# Patient Record
Sex: Female | Born: 1935 | Race: White | Hispanic: No | Marital: Married | Smoking: Former smoker
Health system: Southern US, Community
[De-identification: ages and names within clinical notes are randomized; demographics above are authoritative.]

## PROBLEM LIST (undated history)

## (undated) DIAGNOSIS — G4730 Sleep apnea, unspecified: Secondary | ICD-10-CM

## (undated) DIAGNOSIS — E119 Type 2 diabetes mellitus without complications: Secondary | ICD-10-CM

## (undated) DIAGNOSIS — I10 Essential (primary) hypertension: Secondary | ICD-10-CM

## (undated) HISTORY — DX: Type 2 diabetes mellitus without complications: E11.9

## (undated) HISTORY — DX: Sleep apnea, unspecified: G47.30

## (undated) HISTORY — DX: Essential (primary) hypertension: I10

## (undated) HISTORY — PX: FOOT SURGERY: SHX648

---

## 2001-08-06 ENCOUNTER — Encounter: Payer: Self-pay | Admitting: Neurosurgery

## 2001-08-10 ENCOUNTER — Inpatient Hospital Stay (HOSPITAL_COMMUNITY): Admission: RE | Admit: 2001-08-10 | Discharge: 2001-08-11 | Payer: Self-pay | Admitting: Neurosurgery

## 2001-08-10 ENCOUNTER — Encounter: Payer: Self-pay | Admitting: Neurosurgery

## 2007-10-09 ENCOUNTER — Ambulatory Visit (HOSPITAL_COMMUNITY): Admission: RE | Admit: 2007-10-09 | Discharge: 2007-10-09 | Payer: Self-pay | Admitting: Ophthalmology

## 2010-07-30 NOTE — H&P (Signed)
[place]. [facility]  Patient:    Lisa Larsen, LUMBRA Visit Number: [id number] MRN: [id number]          Service Type: SUR Location: 3000 [id number] Attending Physician:  Emeterio Reeve Dictated by:   Payton Doughty, M.D. 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  HISTORY OF PRESENT ILLNESS: This is a 77 year old right-handed white lady with a couple of years of increasing numbness and pain in her feet, described in the bottom than top, worse on the bottom and top, and there is some disk disease associated with with it.  She has been on Neurontin and several other medications to no avail.  She is currently on Trileptal 200 mg b.i.d., which seems to help a little bit.  EMG showed bilateral S1 radiculopathy.  MRI shows severe spinal stenosis at 5-1 related to facet arthropathy, and she was referred to me.  PAST MEDICAL HISTORY: Remarkable for hypertension.  CURRENT MEDICATIONS:  1. Diovan 12.5 mg q.d.  2. Trileptal 200 mg b.i.d.  4. Multivitamin.  5. Vitamin D.  6. Vitamin E.  7. Glucosamine.  8. B-12.  9. Acid reducer. 10. Tums. 11. Aspirin.  ALLERGIES: None.  PAST SURGICAL HISTORY: Fractured heel in 1999.  SOCIAL HISTORY: She does not smoke and drinks only socially wine, and is retired from [employer].  FAMILY HISTORY: Parents are deceased, history is not given.  REVIEW OF SYSTEMS: Remarkable for hypertension, back pain, and basal cell skin cancer on her face.  PHYSICAL EXAMINATION:  HEENT: Examination within normal limits save from bloodshot left eye.  NECK: Reasonable range of motion.  CHEST: Clear.  CARDIAC: Regular rate and rhythm.  ABDOMEN: Nontender.  No hepatosplenomegaly.  EXTREMITIES: Without clubbing or cyanosis.  Peripheral pulses good.  GU: Examination deferred.  NEUROLOGIC:  She is awake, alert, and oriented.  Cranial nerves intact.  Motor examination shows 5/5 strength throughout the upper and lower extremities.  No current sensory deficits save for S1 distribution bilaterally.  Reflexes are two at the knees, absent at the ankles bilaterally.  Straight-leg-raise is positive bilaterally.  LABORATORY DATA: MRI demonstrates significant stenosis at 5-1 due to facet arthropathy as well as bulging disk centrally, 4-5 has moderate stenosis for the same reasons.  MRA shows a very large S1-S2 disk.  CLINICAL IMPRESSION: Neurogenic claudication secondary to lumbar spondylosis.  PLAN: The plan is for bilateral laminectomy and foraminotomy at L4-5 and L5-S1.  The risks and benefits of this approach have been discussed with her and she wishes to proceed. Dictated by:   Payton Doughty, M.D. Attending Physician:  Emeterio Reeve DD:  08/10/01 TD:  08/11/01 Job: [id number]

## 2010-07-30 NOTE — Op Note (Signed)
[place]. [facility]  Patient:    Lisa Larsen, Lisa Larsen Visit Number: [id number] MRN: [id number]          Service Type: SUR Location: 3000 [id number] [id number] Attending Physician:  Emeterio Reeve Dictated by:   Payton Doughty, M.D. Proc. Date: 08/10/01 Admit Date:  08/10/2001                             Operative Report  PREOPERATIVE DIAGNOSIS:  Spondylosis L5-S1 and L4-L5.  POSTOPERATIVE DIAGNOSIS:  Spondylosis L5-S1 and L4-L5.  OPERATIVE PROCEDURE:  L4-L5 and L5-S1 laminotomy, foraminotomy done bilaterally.  SURGEON:  Payton Doughty, M.D.  ANESTHESIA:  Endotracheal.  PREPARATION:  Sterile Betadine scrub and prep with sterile alcohol wipe.  COMPLICATIONS:  None.  NURSE ASSISTANT:  [place].  DOCTOR ASSISTANT:  Cristi Loron, M.D.  PROCEDURE:  This is a 77 year old lady with severe radicular claudication in the S1 distribution. She was taken to the operating room, where she was smoothly anesthetized and intubated, and placed prone on the operating table. Following shave, prep and drape in the usual sterile fashion, skin was infiltrated with 1% lidocaine 1:400,000 with epinephrine. The skin was incised from the top of S2 to the bottom of L3, and lamina of L4, L5, and S1 were exposed bilaterally. Several x-rays were taken to confirm correctness of level. The patient had a very prominent S1-S2 interspace and meticulous care was taken to make sure decompression was done at the appropriate levels. At L5-S1 and L4-5, hemi-semilaminectomies were carried out to the top of the ligamentum flavum which was then removed in retrograde fashion. The facet joint was undercut at L5-S1 and the S1 nerve root carefully identified and decompressed. The L5 root was also identified, followed at the neural foramen, and decompressed. At L4-5, ligamentum flavum was removed and the L4 root and L5 root were carefully identified and decompressed. A large portion of the pars  interarticularis was left intact. There was no disruption of facet joint capsules. Following complete decompression bilaterally and exploration of neural foramen, the wound was irrigated and hemostasis assured. Subcutaneous fat was taken and placed in the laminotomy defects. The fascia was then reapproximated with 0 Vicryl in interrupted fashion. Subcutaneous tissues were reapproximated with 0 Vicryl in interrupted fashion. Subcuticular tissues were approximated with 3-0 Vicryl in interrupted fashion. The skin was closed with 3-0 nylon in running lock fashion. A bacitracin and Telfa dressing was applied and made occlusive at the op site. The patient then returned to the recovery room in good condition. Dictated by:   Payton Doughty, M.D. Attending Physician:  Emeterio Reeve DD:  08/10/01 TD:  08/11/01 Job: [id number]

## 2010-12-10 LAB — BASIC METABOLIC PANEL
BUN: 19
CO2: 29
Calcium: 9.9
Chloride: 104
Creatinine, Ser: 0.72
GFR calc Af Amer: >60
GFR calc non Af Amer: >60
Glucose, Bld: 83
Potassium: 3.9
Sodium: 139

## 2010-12-10 LAB — CBC
HCT: 40.5
Hemoglobin: 14.3
MCHC: 35.2
MCV: 99.8
Platelets: 117 — ABNORMAL LOW
RBC: 4.06
RDW: 12.5
WBC: 6.3

## 2014-03-17 DIAGNOSIS — Z1231 Encounter for screening mammogram for malignant neoplasm of breast: Secondary | ICD-10-CM | POA: Diagnosis not present

## 2014-03-24 DIAGNOSIS — Z08 Encounter for follow-up examination after completed treatment for malignant neoplasm: Secondary | ICD-10-CM | POA: Diagnosis not present

## 2014-03-24 DIAGNOSIS — X32XXXA Exposure to sunlight, initial encounter: Secondary | ICD-10-CM | POA: Diagnosis not present

## 2014-03-24 DIAGNOSIS — Z85828 Personal history of other malignant neoplasm of skin: Secondary | ICD-10-CM | POA: Diagnosis not present

## 2014-03-24 DIAGNOSIS — D220 Melanocytic nevi of lip: Secondary | ICD-10-CM | POA: Diagnosis not present

## 2014-03-24 DIAGNOSIS — L570 Actinic keratosis: Secondary | ICD-10-CM | POA: Diagnosis not present

## 2014-03-24 DIAGNOSIS — L820 Inflamed seborrheic keratosis: Secondary | ICD-10-CM | POA: Diagnosis not present

## 2014-03-24 DIAGNOSIS — D0471 Carcinoma in situ of skin of right lower limb, including hip: Secondary | ICD-10-CM | POA: Diagnosis not present

## 2014-03-26 DIAGNOSIS — E1165 Type 2 diabetes mellitus with hyperglycemia: Secondary | ICD-10-CM | POA: Diagnosis not present

## 2014-03-26 DIAGNOSIS — I10 Essential (primary) hypertension: Secondary | ICD-10-CM | POA: Diagnosis not present

## 2014-03-26 DIAGNOSIS — E782 Mixed hyperlipidemia: Secondary | ICD-10-CM | POA: Diagnosis not present

## 2014-03-26 DIAGNOSIS — E780 Pure hypercholesterolemia: Secondary | ICD-10-CM | POA: Diagnosis not present

## 2014-04-02 DIAGNOSIS — E1142 Type 2 diabetes mellitus with diabetic polyneuropathy: Secondary | ICD-10-CM | POA: Diagnosis not present

## 2014-04-02 DIAGNOSIS — Z1389 Encounter for screening for other disorder: Secondary | ICD-10-CM | POA: Diagnosis not present

## 2014-04-02 DIAGNOSIS — G4733 Obstructive sleep apnea (adult) (pediatric): Secondary | ICD-10-CM | POA: Diagnosis not present

## 2014-04-02 DIAGNOSIS — E782 Mixed hyperlipidemia: Secondary | ICD-10-CM | POA: Diagnosis not present

## 2014-04-02 DIAGNOSIS — E6609 Other obesity due to excess calories: Secondary | ICD-10-CM | POA: Diagnosis not present

## 2014-04-02 DIAGNOSIS — E8881 Metabolic syndrome: Secondary | ICD-10-CM | POA: Diagnosis not present

## 2014-07-03 DIAGNOSIS — G4733 Obstructive sleep apnea (adult) (pediatric): Secondary | ICD-10-CM | POA: Diagnosis not present

## 2014-07-10 DIAGNOSIS — R0902 Hypoxemia: Secondary | ICD-10-CM | POA: Diagnosis not present

## 2014-08-09 DIAGNOSIS — R0902 Hypoxemia: Secondary | ICD-10-CM | POA: Diagnosis not present

## 2014-08-09 DIAGNOSIS — G4733 Obstructive sleep apnea (adult) (pediatric): Secondary | ICD-10-CM | POA: Diagnosis not present

## 2014-08-25 DIAGNOSIS — X32XXXD Exposure to sunlight, subsequent encounter: Secondary | ICD-10-CM | POA: Diagnosis not present

## 2014-08-25 DIAGNOSIS — L570 Actinic keratosis: Secondary | ICD-10-CM | POA: Diagnosis not present

## 2014-08-25 DIAGNOSIS — C44729 Squamous cell carcinoma of skin of left lower limb, including hip: Secondary | ICD-10-CM | POA: Diagnosis not present

## 2014-09-02 DIAGNOSIS — L0100 Impetigo, unspecified: Secondary | ICD-10-CM | POA: Diagnosis not present

## 2014-09-09 DIAGNOSIS — R0902 Hypoxemia: Secondary | ICD-10-CM | POA: Diagnosis not present

## 2014-09-11 DIAGNOSIS — R4182 Altered mental status, unspecified: Secondary | ICD-10-CM | POA: Diagnosis not present

## 2014-09-11 DIAGNOSIS — G629 Polyneuropathy, unspecified: Secondary | ICD-10-CM | POA: Diagnosis not present

## 2014-09-11 DIAGNOSIS — J449 Chronic obstructive pulmonary disease, unspecified: Secondary | ICD-10-CM | POA: Diagnosis not present

## 2014-09-11 DIAGNOSIS — I959 Hypotension, unspecified: Secondary | ICD-10-CM | POA: Diagnosis not present

## 2014-09-11 DIAGNOSIS — K760 Fatty (change of) liver, not elsewhere classified: Secondary | ICD-10-CM | POA: Diagnosis not present

## 2014-09-11 DIAGNOSIS — Z79899 Other long term (current) drug therapy: Secondary | ICD-10-CM | POA: Diagnosis not present

## 2014-09-11 DIAGNOSIS — J984 Other disorders of lung: Secondary | ICD-10-CM | POA: Diagnosis not present

## 2014-09-11 DIAGNOSIS — I10 Essential (primary) hypertension: Secondary | ICD-10-CM | POA: Diagnosis not present

## 2014-09-11 DIAGNOSIS — Z7982 Long term (current) use of aspirin: Secondary | ICD-10-CM | POA: Diagnosis not present

## 2014-09-11 DIAGNOSIS — D696 Thrombocytopenia, unspecified: Secondary | ICD-10-CM | POA: Diagnosis not present

## 2014-09-11 DIAGNOSIS — G459 Transient cerebral ischemic attack, unspecified: Secondary | ICD-10-CM | POA: Diagnosis not present

## 2014-09-11 DIAGNOSIS — R918 Other nonspecific abnormal finding of lung field: Secondary | ICD-10-CM | POA: Diagnosis not present

## 2014-09-11 DIAGNOSIS — R55 Syncope and collapse: Secondary | ICD-10-CM | POA: Diagnosis not present

## 2014-09-11 DIAGNOSIS — Z79891 Long term (current) use of opiate analgesic: Secondary | ICD-10-CM | POA: Diagnosis not present

## 2014-09-11 DIAGNOSIS — I6523 Occlusion and stenosis of bilateral carotid arteries: Secondary | ICD-10-CM | POA: Diagnosis not present

## 2014-09-11 DIAGNOSIS — J9811 Atelectasis: Secondary | ICD-10-CM | POA: Diagnosis not present

## 2014-09-11 DIAGNOSIS — E860 Dehydration: Secondary | ICD-10-CM | POA: Diagnosis not present

## 2014-09-11 DIAGNOSIS — M1990 Unspecified osteoarthritis, unspecified site: Secondary | ICD-10-CM | POA: Diagnosis not present

## 2014-09-11 DIAGNOSIS — T678XXA Other effects of heat and light, initial encounter: Secondary | ICD-10-CM | POA: Diagnosis not present

## 2014-09-11 DIAGNOSIS — E785 Hyperlipidemia, unspecified: Secondary | ICD-10-CM | POA: Diagnosis not present

## 2014-09-11 DIAGNOSIS — M545 Low back pain: Secondary | ICD-10-CM | POA: Diagnosis not present

## 2014-09-11 DIAGNOSIS — R410 Disorientation, unspecified: Secondary | ICD-10-CM | POA: Diagnosis not present

## 2014-09-11 DIAGNOSIS — R4781 Slurred speech: Secondary | ICD-10-CM | POA: Diagnosis not present

## 2014-09-12 DIAGNOSIS — G459 Transient cerebral ischemic attack, unspecified: Secondary | ICD-10-CM | POA: Diagnosis not present

## 2014-09-12 DIAGNOSIS — I959 Hypotension, unspecified: Secondary | ICD-10-CM | POA: Diagnosis not present

## 2014-09-29 DIAGNOSIS — Z85828 Personal history of other malignant neoplasm of skin: Secondary | ICD-10-CM | POA: Diagnosis not present

## 2014-09-29 DIAGNOSIS — Z08 Encounter for follow-up examination after completed treatment for malignant neoplasm: Secondary | ICD-10-CM | POA: Diagnosis not present

## 2014-09-29 DIAGNOSIS — L980 Pyogenic granuloma: Secondary | ICD-10-CM | POA: Diagnosis not present

## 2014-10-09 DIAGNOSIS — R0902 Hypoxemia: Secondary | ICD-10-CM | POA: Diagnosis not present

## 2014-10-12 DIAGNOSIS — I700 Atherosclerosis of aorta: Secondary | ICD-10-CM | POA: Diagnosis not present

## 2014-10-12 DIAGNOSIS — T673XXD Heat exhaustion, anhydrotic, subsequent encounter: Secondary | ICD-10-CM | POA: Diagnosis not present

## 2014-10-12 DIAGNOSIS — R911 Solitary pulmonary nodule: Secondary | ICD-10-CM | POA: Diagnosis not present

## 2014-10-12 DIAGNOSIS — I6523 Occlusion and stenosis of bilateral carotid arteries: Secondary | ICD-10-CM | POA: Diagnosis not present

## 2014-10-13 DIAGNOSIS — Z08 Encounter for follow-up examination after completed treatment for malignant neoplasm: Secondary | ICD-10-CM | POA: Diagnosis not present

## 2014-10-13 DIAGNOSIS — Z85828 Personal history of other malignant neoplasm of skin: Secondary | ICD-10-CM | POA: Diagnosis not present

## 2014-10-16 DIAGNOSIS — E782 Mixed hyperlipidemia: Secondary | ICD-10-CM | POA: Diagnosis not present

## 2014-10-16 DIAGNOSIS — I10 Essential (primary) hypertension: Secondary | ICD-10-CM | POA: Diagnosis not present

## 2014-10-16 DIAGNOSIS — E8881 Metabolic syndrome: Secondary | ICD-10-CM | POA: Diagnosis not present

## 2014-10-16 DIAGNOSIS — E1142 Type 2 diabetes mellitus with diabetic polyneuropathy: Secondary | ICD-10-CM | POA: Diagnosis not present

## 2014-10-22 DIAGNOSIS — E1142 Type 2 diabetes mellitus with diabetic polyneuropathy: Secondary | ICD-10-CM | POA: Diagnosis not present

## 2014-10-22 DIAGNOSIS — G6289 Other specified polyneuropathies: Secondary | ICD-10-CM | POA: Diagnosis not present

## 2014-10-22 DIAGNOSIS — E6609 Other obesity due to excess calories: Secondary | ICD-10-CM | POA: Diagnosis not present

## 2014-10-22 DIAGNOSIS — Z0001 Encounter for general adult medical examination with abnormal findings: Secondary | ICD-10-CM | POA: Diagnosis not present

## 2014-10-22 DIAGNOSIS — G4733 Obstructive sleep apnea (adult) (pediatric): Secondary | ICD-10-CM | POA: Diagnosis not present

## 2014-11-03 DIAGNOSIS — L929 Granulomatous disorder of the skin and subcutaneous tissue, unspecified: Secondary | ICD-10-CM | POA: Diagnosis not present

## 2014-11-03 DIAGNOSIS — Z85828 Personal history of other malignant neoplasm of skin: Secondary | ICD-10-CM | POA: Diagnosis not present

## 2014-11-03 DIAGNOSIS — Z08 Encounter for follow-up examination after completed treatment for malignant neoplasm: Secondary | ICD-10-CM | POA: Diagnosis not present

## 2014-11-09 DIAGNOSIS — R0902 Hypoxemia: Secondary | ICD-10-CM | POA: Diagnosis not present

## 2014-11-13 DIAGNOSIS — Z86010 Personal history of colonic polyps: Secondary | ICD-10-CM | POA: Diagnosis not present

## 2014-11-20 DIAGNOSIS — I10 Essential (primary) hypertension: Secondary | ICD-10-CM | POA: Diagnosis not present

## 2014-11-20 DIAGNOSIS — M1990 Unspecified osteoarthritis, unspecified site: Secondary | ICD-10-CM | POA: Diagnosis not present

## 2014-11-20 DIAGNOSIS — Z7982 Long term (current) use of aspirin: Secondary | ICD-10-CM | POA: Diagnosis not present

## 2014-11-20 DIAGNOSIS — Z86010 Personal history of colonic polyps: Secondary | ICD-10-CM | POA: Diagnosis not present

## 2014-11-20 DIAGNOSIS — Z1211 Encounter for screening for malignant neoplasm of colon: Secondary | ICD-10-CM | POA: Diagnosis not present

## 2014-11-20 DIAGNOSIS — J449 Chronic obstructive pulmonary disease, unspecified: Secondary | ICD-10-CM | POA: Diagnosis not present

## 2014-11-20 DIAGNOSIS — E785 Hyperlipidemia, unspecified: Secondary | ICD-10-CM | POA: Diagnosis not present

## 2014-11-20 DIAGNOSIS — Z79899 Other long term (current) drug therapy: Secondary | ICD-10-CM | POA: Diagnosis not present

## 2014-12-10 DIAGNOSIS — R0902 Hypoxemia: Secondary | ICD-10-CM | POA: Diagnosis not present

## 2014-12-17 DIAGNOSIS — G4733 Obstructive sleep apnea (adult) (pediatric): Secondary | ICD-10-CM | POA: Diagnosis not present

## 2015-01-05 DIAGNOSIS — L821 Other seborrheic keratosis: Secondary | ICD-10-CM | POA: Diagnosis not present

## 2015-01-05 DIAGNOSIS — Z85828 Personal history of other malignant neoplasm of skin: Secondary | ICD-10-CM | POA: Diagnosis not present

## 2015-01-05 DIAGNOSIS — Z08 Encounter for follow-up examination after completed treatment for malignant neoplasm: Secondary | ICD-10-CM | POA: Diagnosis not present

## 2015-01-09 DIAGNOSIS — R0902 Hypoxemia: Secondary | ICD-10-CM | POA: Diagnosis not present

## 2015-02-09 DIAGNOSIS — R0902 Hypoxemia: Secondary | ICD-10-CM | POA: Diagnosis not present

## 2015-03-11 DIAGNOSIS — R0902 Hypoxemia: Secondary | ICD-10-CM | POA: Diagnosis not present

## 2015-03-20 DIAGNOSIS — Z1231 Encounter for screening mammogram for malignant neoplasm of breast: Secondary | ICD-10-CM | POA: Diagnosis not present

## 2015-04-11 DIAGNOSIS — R0902 Hypoxemia: Secondary | ICD-10-CM | POA: Diagnosis not present

## 2015-04-13 DIAGNOSIS — E1142 Type 2 diabetes mellitus with diabetic polyneuropathy: Secondary | ICD-10-CM | POA: Diagnosis not present

## 2015-04-13 DIAGNOSIS — I10 Essential (primary) hypertension: Secondary | ICD-10-CM | POA: Diagnosis not present

## 2015-04-13 DIAGNOSIS — E782 Mixed hyperlipidemia: Secondary | ICD-10-CM | POA: Diagnosis not present

## 2015-04-13 DIAGNOSIS — J449 Chronic obstructive pulmonary disease, unspecified: Secondary | ICD-10-CM | POA: Diagnosis not present

## 2015-04-20 DIAGNOSIS — E1142 Type 2 diabetes mellitus with diabetic polyneuropathy: Secondary | ICD-10-CM | POA: Diagnosis not present

## 2015-04-20 DIAGNOSIS — Z1389 Encounter for screening for other disorder: Secondary | ICD-10-CM | POA: Diagnosis not present

## 2015-04-20 DIAGNOSIS — E782 Mixed hyperlipidemia: Secondary | ICD-10-CM | POA: Diagnosis not present

## 2015-04-20 DIAGNOSIS — G4733 Obstructive sleep apnea (adult) (pediatric): Secondary | ICD-10-CM | POA: Diagnosis not present

## 2015-05-12 DIAGNOSIS — R0902 Hypoxemia: Secondary | ICD-10-CM | POA: Diagnosis not present

## 2015-05-27 DIAGNOSIS — G4733 Obstructive sleep apnea (adult) (pediatric): Secondary | ICD-10-CM | POA: Diagnosis not present

## 2015-06-05 DIAGNOSIS — G4733 Obstructive sleep apnea (adult) (pediatric): Secondary | ICD-10-CM | POA: Diagnosis not present

## 2015-06-09 DIAGNOSIS — R0902 Hypoxemia: Secondary | ICD-10-CM | POA: Diagnosis not present

## 2015-06-27 DIAGNOSIS — G4733 Obstructive sleep apnea (adult) (pediatric): Secondary | ICD-10-CM | POA: Diagnosis not present

## 2015-07-02 DIAGNOSIS — H524 Presbyopia: Secondary | ICD-10-CM | POA: Diagnosis not present

## 2015-07-02 DIAGNOSIS — Z961 Presence of intraocular lens: Secondary | ICD-10-CM | POA: Diagnosis not present

## 2015-07-02 DIAGNOSIS — H00029 Hordeolum internum unspecified eye, unspecified eyelid: Secondary | ICD-10-CM | POA: Diagnosis not present

## 2015-07-10 DIAGNOSIS — R0902 Hypoxemia: Secondary | ICD-10-CM | POA: Diagnosis not present

## 2015-07-14 DIAGNOSIS — L03031 Cellulitis of right toe: Secondary | ICD-10-CM | POA: Diagnosis not present

## 2015-07-14 DIAGNOSIS — L600 Ingrowing nail: Secondary | ICD-10-CM | POA: Diagnosis not present

## 2015-07-14 DIAGNOSIS — M79671 Pain in right foot: Secondary | ICD-10-CM | POA: Diagnosis not present

## 2015-07-14 DIAGNOSIS — L03032 Cellulitis of left toe: Secondary | ICD-10-CM | POA: Diagnosis not present

## 2015-07-27 DIAGNOSIS — G4733 Obstructive sleep apnea (adult) (pediatric): Secondary | ICD-10-CM | POA: Diagnosis not present

## 2015-08-05 DIAGNOSIS — H6123 Impacted cerumen, bilateral: Secondary | ICD-10-CM | POA: Diagnosis not present

## 2015-08-05 DIAGNOSIS — H66002 Acute suppurative otitis media without spontaneous rupture of ear drum, left ear: Secondary | ICD-10-CM | POA: Diagnosis not present

## 2015-08-09 DIAGNOSIS — R0902 Hypoxemia: Secondary | ICD-10-CM | POA: Diagnosis not present

## 2015-08-24 DIAGNOSIS — I10 Essential (primary) hypertension: Secondary | ICD-10-CM | POA: Diagnosis not present

## 2015-08-24 DIAGNOSIS — E1142 Type 2 diabetes mellitus with diabetic polyneuropathy: Secondary | ICD-10-CM | POA: Diagnosis not present

## 2015-08-24 DIAGNOSIS — E782 Mixed hyperlipidemia: Secondary | ICD-10-CM | POA: Diagnosis not present

## 2015-08-24 DIAGNOSIS — G4733 Obstructive sleep apnea (adult) (pediatric): Secondary | ICD-10-CM | POA: Diagnosis not present

## 2015-08-27 DIAGNOSIS — E782 Mixed hyperlipidemia: Secondary | ICD-10-CM | POA: Diagnosis not present

## 2015-08-27 DIAGNOSIS — Z1212 Encounter for screening for malignant neoplasm of rectum: Secondary | ICD-10-CM | POA: Diagnosis not present

## 2015-08-27 DIAGNOSIS — G4733 Obstructive sleep apnea (adult) (pediatric): Secondary | ICD-10-CM | POA: Diagnosis not present

## 2015-08-27 DIAGNOSIS — E1142 Type 2 diabetes mellitus with diabetic polyneuropathy: Secondary | ICD-10-CM | POA: Diagnosis not present

## 2015-09-09 DIAGNOSIS — R0902 Hypoxemia: Secondary | ICD-10-CM | POA: Diagnosis not present

## 2015-09-10 DIAGNOSIS — I10 Essential (primary) hypertension: Secondary | ICD-10-CM | POA: Diagnosis not present

## 2015-09-26 DIAGNOSIS — G4733 Obstructive sleep apnea (adult) (pediatric): Secondary | ICD-10-CM | POA: Diagnosis not present

## 2015-10-05 DIAGNOSIS — B351 Tinea unguium: Secondary | ICD-10-CM | POA: Diagnosis not present

## 2015-10-05 DIAGNOSIS — M79672 Pain in left foot: Secondary | ICD-10-CM | POA: Diagnosis not present

## 2015-10-05 DIAGNOSIS — L600 Ingrowing nail: Secondary | ICD-10-CM | POA: Diagnosis not present

## 2015-10-05 DIAGNOSIS — L03032 Cellulitis of left toe: Secondary | ICD-10-CM | POA: Diagnosis not present

## 2015-10-09 DIAGNOSIS — R0902 Hypoxemia: Secondary | ICD-10-CM | POA: Diagnosis not present

## 2015-10-27 DIAGNOSIS — G4733 Obstructive sleep apnea (adult) (pediatric): Secondary | ICD-10-CM | POA: Diagnosis not present

## 2015-11-09 DIAGNOSIS — R0902 Hypoxemia: Secondary | ICD-10-CM | POA: Diagnosis not present

## 2015-11-09 DIAGNOSIS — G4733 Obstructive sleep apnea (adult) (pediatric): Secondary | ICD-10-CM | POA: Diagnosis not present

## 2015-11-23 DIAGNOSIS — G6289 Other specified polyneuropathies: Secondary | ICD-10-CM | POA: Diagnosis not present

## 2015-11-23 DIAGNOSIS — G4733 Obstructive sleep apnea (adult) (pediatric): Secondary | ICD-10-CM | POA: Diagnosis not present

## 2015-11-23 DIAGNOSIS — I700 Atherosclerosis of aorta: Secondary | ICD-10-CM | POA: Diagnosis not present

## 2015-11-23 DIAGNOSIS — I6523 Occlusion and stenosis of bilateral carotid arteries: Secondary | ICD-10-CM | POA: Diagnosis not present

## 2015-11-25 DIAGNOSIS — E119 Type 2 diabetes mellitus without complications: Secondary | ICD-10-CM | POA: Diagnosis not present

## 2015-11-25 DIAGNOSIS — E785 Hyperlipidemia, unspecified: Secondary | ICD-10-CM | POA: Diagnosis not present

## 2015-11-25 DIAGNOSIS — R0989 Other specified symptoms and signs involving the circulatory and respiratory systems: Secondary | ICD-10-CM | POA: Diagnosis not present

## 2015-11-25 DIAGNOSIS — I6523 Occlusion and stenosis of bilateral carotid arteries: Secondary | ICD-10-CM | POA: Diagnosis not present

## 2015-11-25 DIAGNOSIS — I10 Essential (primary) hypertension: Secondary | ICD-10-CM | POA: Diagnosis not present

## 2015-11-27 DIAGNOSIS — G4733 Obstructive sleep apnea (adult) (pediatric): Secondary | ICD-10-CM | POA: Diagnosis not present

## 2015-12-04 DIAGNOSIS — G4733 Obstructive sleep apnea (adult) (pediatric): Secondary | ICD-10-CM | POA: Diagnosis not present

## 2015-12-09 DIAGNOSIS — E7800 Pure hypercholesterolemia, unspecified: Secondary | ICD-10-CM | POA: Diagnosis not present

## 2015-12-09 DIAGNOSIS — E119 Type 2 diabetes mellitus without complications: Secondary | ICD-10-CM | POA: Diagnosis not present

## 2015-12-09 DIAGNOSIS — I10 Essential (primary) hypertension: Secondary | ICD-10-CM | POA: Diagnosis not present

## 2015-12-10 DIAGNOSIS — G4733 Obstructive sleep apnea (adult) (pediatric): Secondary | ICD-10-CM | POA: Diagnosis not present

## 2015-12-10 DIAGNOSIS — R0902 Hypoxemia: Secondary | ICD-10-CM | POA: Diagnosis not present

## 2015-12-15 DIAGNOSIS — G4733 Obstructive sleep apnea (adult) (pediatric): Secondary | ICD-10-CM | POA: Diagnosis not present

## 2015-12-17 DIAGNOSIS — Z0000 Encounter for general adult medical examination without abnormal findings: Secondary | ICD-10-CM | POA: Diagnosis not present

## 2015-12-17 DIAGNOSIS — G6289 Other specified polyneuropathies: Secondary | ICD-10-CM | POA: Diagnosis not present

## 2015-12-17 DIAGNOSIS — E1165 Type 2 diabetes mellitus with hyperglycemia: Secondary | ICD-10-CM | POA: Diagnosis not present

## 2015-12-17 DIAGNOSIS — I10 Essential (primary) hypertension: Secondary | ICD-10-CM | POA: Diagnosis not present

## 2015-12-17 DIAGNOSIS — E782 Mixed hyperlipidemia: Secondary | ICD-10-CM | POA: Diagnosis not present

## 2015-12-22 DIAGNOSIS — Z0001 Encounter for general adult medical examination with abnormal findings: Secondary | ICD-10-CM | POA: Diagnosis not present

## 2015-12-22 DIAGNOSIS — Z23 Encounter for immunization: Secondary | ICD-10-CM | POA: Diagnosis not present

## 2015-12-22 DIAGNOSIS — E782 Mixed hyperlipidemia: Secondary | ICD-10-CM | POA: Diagnosis not present

## 2015-12-22 DIAGNOSIS — E1142 Type 2 diabetes mellitus with diabetic polyneuropathy: Secondary | ICD-10-CM | POA: Diagnosis not present

## 2015-12-22 DIAGNOSIS — I700 Atherosclerosis of aorta: Secondary | ICD-10-CM | POA: Diagnosis not present

## 2015-12-22 DIAGNOSIS — J449 Chronic obstructive pulmonary disease, unspecified: Secondary | ICD-10-CM | POA: Diagnosis not present

## 2015-12-27 DIAGNOSIS — G4733 Obstructive sleep apnea (adult) (pediatric): Secondary | ICD-10-CM | POA: Diagnosis not present

## 2015-12-28 ENCOUNTER — Ambulatory Visit: Payer: Self-pay | Admitting: Cardiology

## 2016-01-09 DIAGNOSIS — R0902 Hypoxemia: Secondary | ICD-10-CM | POA: Diagnosis not present

## 2016-01-27 DIAGNOSIS — G4733 Obstructive sleep apnea (adult) (pediatric): Secondary | ICD-10-CM | POA: Diagnosis not present

## 2016-02-03 DIAGNOSIS — Z87891 Personal history of nicotine dependence: Secondary | ICD-10-CM | POA: Diagnosis not present

## 2016-02-03 DIAGNOSIS — I10 Essential (primary) hypertension: Secondary | ICD-10-CM | POA: Diagnosis not present

## 2016-02-03 DIAGNOSIS — Z780 Asymptomatic menopausal state: Secondary | ICD-10-CM | POA: Diagnosis not present

## 2016-02-03 DIAGNOSIS — M85851 Other specified disorders of bone density and structure, right thigh: Secondary | ICD-10-CM | POA: Diagnosis not present

## 2016-02-03 DIAGNOSIS — J449 Chronic obstructive pulmonary disease, unspecified: Secondary | ICD-10-CM | POA: Diagnosis not present

## 2016-02-03 DIAGNOSIS — Z79899 Other long term (current) drug therapy: Secondary | ICD-10-CM | POA: Diagnosis not present

## 2016-02-03 DIAGNOSIS — M810 Age-related osteoporosis without current pathological fracture: Secondary | ICD-10-CM | POA: Diagnosis not present

## 2016-02-03 DIAGNOSIS — E1140 Type 2 diabetes mellitus with diabetic neuropathy, unspecified: Secondary | ICD-10-CM | POA: Diagnosis not present

## 2016-02-03 DIAGNOSIS — E7800 Pure hypercholesterolemia, unspecified: Secondary | ICD-10-CM | POA: Diagnosis not present

## 2016-02-03 DIAGNOSIS — Z7982 Long term (current) use of aspirin: Secondary | ICD-10-CM | POA: Diagnosis not present

## 2016-02-03 DIAGNOSIS — Z7984 Long term (current) use of oral hypoglycemic drugs: Secondary | ICD-10-CM | POA: Diagnosis not present

## 2016-02-09 DIAGNOSIS — R0902 Hypoxemia: Secondary | ICD-10-CM | POA: Diagnosis not present

## 2016-02-22 DIAGNOSIS — E1142 Type 2 diabetes mellitus with diabetic polyneuropathy: Secondary | ICD-10-CM | POA: Diagnosis not present

## 2016-02-22 DIAGNOSIS — J209 Acute bronchitis, unspecified: Secondary | ICD-10-CM | POA: Diagnosis not present

## 2016-02-22 DIAGNOSIS — J449 Chronic obstructive pulmonary disease, unspecified: Secondary | ICD-10-CM | POA: Diagnosis not present

## 2016-02-22 DIAGNOSIS — I10 Essential (primary) hypertension: Secondary | ICD-10-CM | POA: Diagnosis not present

## 2016-02-26 DIAGNOSIS — G4733 Obstructive sleep apnea (adult) (pediatric): Secondary | ICD-10-CM | POA: Diagnosis not present

## 2016-03-02 DIAGNOSIS — J449 Chronic obstructive pulmonary disease, unspecified: Secondary | ICD-10-CM | POA: Diagnosis not present

## 2016-03-02 DIAGNOSIS — I10 Essential (primary) hypertension: Secondary | ICD-10-CM | POA: Diagnosis not present

## 2016-03-02 DIAGNOSIS — E1142 Type 2 diabetes mellitus with diabetic polyneuropathy: Secondary | ICD-10-CM | POA: Diagnosis not present

## 2016-03-02 DIAGNOSIS — J209 Acute bronchitis, unspecified: Secondary | ICD-10-CM | POA: Diagnosis not present

## 2016-03-10 DIAGNOSIS — R0902 Hypoxemia: Secondary | ICD-10-CM | POA: Diagnosis not present

## 2016-03-28 DIAGNOSIS — G4733 Obstructive sleep apnea (adult) (pediatric): Secondary | ICD-10-CM | POA: Diagnosis not present

## 2016-04-04 DIAGNOSIS — J0100 Acute maxillary sinusitis, unspecified: Secondary | ICD-10-CM | POA: Diagnosis not present

## 2016-04-10 DIAGNOSIS — R0902 Hypoxemia: Secondary | ICD-10-CM | POA: Diagnosis not present

## 2016-04-28 DIAGNOSIS — G4733 Obstructive sleep apnea (adult) (pediatric): Secondary | ICD-10-CM | POA: Diagnosis not present

## 2016-05-04 DIAGNOSIS — E782 Mixed hyperlipidemia: Secondary | ICD-10-CM | POA: Diagnosis not present

## 2016-05-04 DIAGNOSIS — I10 Essential (primary) hypertension: Secondary | ICD-10-CM | POA: Diagnosis not present

## 2016-05-04 DIAGNOSIS — E1142 Type 2 diabetes mellitus with diabetic polyneuropathy: Secondary | ICD-10-CM | POA: Diagnosis not present

## 2016-05-04 DIAGNOSIS — J449 Chronic obstructive pulmonary disease, unspecified: Secondary | ICD-10-CM | POA: Diagnosis not present

## 2016-05-06 DIAGNOSIS — G4733 Obstructive sleep apnea (adult) (pediatric): Secondary | ICD-10-CM | POA: Diagnosis not present

## 2016-05-06 DIAGNOSIS — I700 Atherosclerosis of aorta: Secondary | ICD-10-CM | POA: Diagnosis not present

## 2016-05-06 DIAGNOSIS — G6289 Other specified polyneuropathies: Secondary | ICD-10-CM | POA: Diagnosis not present

## 2016-05-06 DIAGNOSIS — I6523 Occlusion and stenosis of bilateral carotid arteries: Secondary | ICD-10-CM | POA: Diagnosis not present

## 2016-05-11 DIAGNOSIS — R0902 Hypoxemia: Secondary | ICD-10-CM | POA: Diagnosis not present

## 2016-06-08 DIAGNOSIS — R0902 Hypoxemia: Secondary | ICD-10-CM | POA: Diagnosis not present

## 2016-07-09 DIAGNOSIS — R0902 Hypoxemia: Secondary | ICD-10-CM | POA: Diagnosis not present

## 2016-07-21 DIAGNOSIS — G4733 Obstructive sleep apnea (adult) (pediatric): Secondary | ICD-10-CM | POA: Diagnosis not present

## 2016-08-08 DIAGNOSIS — R0902 Hypoxemia: Secondary | ICD-10-CM | POA: Diagnosis not present

## 2016-08-15 DIAGNOSIS — E7800 Pure hypercholesterolemia, unspecified: Secondary | ICD-10-CM | POA: Diagnosis not present

## 2016-08-15 DIAGNOSIS — I10 Essential (primary) hypertension: Secondary | ICD-10-CM | POA: Diagnosis not present

## 2016-08-15 DIAGNOSIS — E119 Type 2 diabetes mellitus without complications: Secondary | ICD-10-CM | POA: Diagnosis not present

## 2016-09-08 DIAGNOSIS — R0902 Hypoxemia: Secondary | ICD-10-CM | POA: Diagnosis not present

## 2016-10-08 DIAGNOSIS — R0902 Hypoxemia: Secondary | ICD-10-CM | POA: Diagnosis not present

## 2016-10-10 ENCOUNTER — Encounter: Payer: Medicare Other | Attending: Endocrinology | Admitting: Nutrition

## 2016-10-10 VITALS — Ht 64.0 in | Wt 166.0 lb

## 2016-10-10 DIAGNOSIS — IMO0002 Reserved for concepts with insufficient information to code with codable children: Secondary | ICD-10-CM

## 2016-10-10 DIAGNOSIS — E119 Type 2 diabetes mellitus without complications: Secondary | ICD-10-CM | POA: Insufficient documentation

## 2016-10-10 DIAGNOSIS — E1165 Type 2 diabetes mellitus with hyperglycemia: Secondary | ICD-10-CM

## 2016-10-10 DIAGNOSIS — E118 Type 2 diabetes mellitus with unspecified complications: Secondary | ICD-10-CM

## 2016-10-10 DIAGNOSIS — Z713 Dietary counseling and surveillance: Secondary | ICD-10-CM | POA: Diagnosis not present

## 2016-10-10 NOTE — Patient Instructions (Signed)
Goals 1. Follow My Plate 2. Eat meals on time 3. Drink 4 bottles of water per day 4. Walk 30 minutes a day 5. Increase fresh fruits and vegetables. Measure foods out.

## 2016-10-13 NOTE — Progress Notes (Signed)
Diabetes Self-Management Education  Visit Type: First/Initial  Appt. Start Time: 1530 Appt. End Time: 1430  10/13/2016  [name], identified by name and date of birth, is a 83 y.o. female with a diagnosis of Diabetes: Type 2.  A1C 6.9%. Metformin and Glipizide.  ASSESSMENT  Height 5\' 4"  (1.626 m), weight 166 lb (75.3 kg). Body mass index is 28.49 kg/m.      Diabetes Self-Management Education - 10/10/16 1530      Visit Information   Visit Type First/Initial     Initial Visit   Diabetes Type Type 2   Are you currently following a meal plan? No   Are you taking your medications as prescribed? Yes     Health Coping   How would you rate your overall health? Good     Psychosocial Assessment   Patient Belief/Attitude about Diabetes Motivated to manage diabetes   Self-care barriers None   Self-management support Family   Other persons present Patient;Spouse/SO   Patient Concerns Nutrition/Meal planning;Medication;Monitoring;Healthy Lifestyle;Weight Control   Special Needs None   Preferred Learning Style No preference indicated   Learning Readiness Ready   How often do you need to have someone help you when you read instructions, pamphlets, or other written materials from your doctor or pharmacy? 1 - Never   What is the last grade level you completed in school? 12     Pre-Education Assessment   Patient understands the diabetes disease and treatment process. Needs Review   Patient understands incorporating nutritional management into lifestyle. Needs Instruction   Patient undertands incorporating physical activity into lifestyle. Needs Instruction   Patient understands using medications safely. Needs Instruction   Patient understands monitoring blood glucose, interpreting and using results Needs Instruction   Patient understands prevention, detection, and treatment of acute complications. Needs Instruction   Patient understands prevention, detection, and treatment of  chronic complications. Needs Instruction   Patient understands how to develop strategies to address psychosocial issues. Needs Instruction   Patient understands how to develop strategies to promote health/change behavior. Needs Instruction     Complications   Fasting Blood glucose range (mg/dL) 70-129   Postprandial Blood glucose range (mg/dL) 130-179   Have you had a dilated eye exam in the past 12 months? Yes   Have you had a dental exam in the past 12 months? No   Are you checking your feet? Yes     Dietary Intake   Breakfast Oatmeal with crasins   Lunch Tomato sandwich   Dinner meat, vegetables, cantelope     Patient Education   Disease state  Definition of diabetes, type 1 and 2, and the diagnosis of diabetes;Factors that contribute to the development of diabetes   Nutrition management  Role of diet in the treatment of diabetes and the relationship between the three main macronutrients and blood glucose level;Carbohydrate counting;Information on hints to eating out and maintain blood glucose control.;Meal timing in regards to the patients' current diabetes medication.   Physical activity and exercise  Role of exercise on diabetes management, blood pressure control and cardiac health.   Medications Reviewed patients medication for diabetes, action, purpose, timing of dose and side effects.   Monitoring Taught/evaluated SMBG meter.;Purpose and frequency of SMBG.;Taught/discussed recording of test results and interpretation of SMBG.   Chronic complications Assessed and discussed foot care and prevention of foot problems;Lipid levels, blood glucose control and heart disease;Dental care;Reviewed with patient heart disease, higher risk of, and prevention   Psychosocial adjustment Role of  stress on diabetes     Individualized Goals (developed by patient)   Nutrition Follow meal plan discussed;Adjust meds/carbs with exercise as discussed   Physical Activity Exercise 3-5 times per week;30  minutes per day   Medications take my medication as prescribed   Monitoring  test my blood glucose as discussed   Reducing Risk examine blood glucose patterns;do foot checks daily;treat hypoglycemia with 15 grams of carbs if blood glucose less than 70mg /dL     Post-Education Assessment   Patient understands the diabetes disease and treatment process. Needs Review   Patient understands incorporating nutritional management into lifestyle. Needs Review   Patient undertands incorporating physical activity into lifestyle. Needs Review   Patient understands using medications safely. Needs Review   Patient understands monitoring blood glucose, interpreting and using results Needs Review   Patient understands prevention, detection, and treatment of acute complications. Needs Review   Patient understands prevention, detection, and treatment of chronic complications. Needs Review   Patient understands how to develop strategies to address psychosocial issues. Needs Review   Patient understands how to develop strategies to promote health/change behavior. Needs Review     Outcomes   Expected Outcomes Demonstrated interest in learning. Expect positive outcomes   Future DMSE 4-6 wks   Program Status Completed      Individualized Plan for Diabetes Self-Management Training:   Learning Objective:  Patient will have a greater understanding of diabetes self-management. Patient education plan is to attend individual and/or group sessions per assessed needs and concerns.   Plan:   Patient Instructions  Goals 1. Follow My Plate 2. Eat meals on time 3. Drink 4 bottles of water per day 4. Walk 30 minutes a day 5. Increase fresh fruits and vegetables. Measure foods out.    Expected Outcomes:  Demonstrated interest in learning. Expect positive outcomes  Education material provided: Living Well with Diabetes, Food label handouts, A1C conversion sheet, Meal plan card, My Plate and Carbohydrate counting  sheet  If problems or questions, patient to contact team via:  Phone and Email  Future DSME appointment: 4-6 wks

## 2016-11-08 DIAGNOSIS — G4733 Obstructive sleep apnea (adult) (pediatric): Secondary | ICD-10-CM | POA: Diagnosis not present

## 2016-11-08 DIAGNOSIS — E1165 Type 2 diabetes mellitus with hyperglycemia: Secondary | ICD-10-CM | POA: Diagnosis not present

## 2016-11-08 DIAGNOSIS — E782 Mixed hyperlipidemia: Secondary | ICD-10-CM | POA: Diagnosis not present

## 2016-11-08 DIAGNOSIS — E1142 Type 2 diabetes mellitus with diabetic polyneuropathy: Secondary | ICD-10-CM | POA: Diagnosis not present

## 2016-11-10 ENCOUNTER — Ambulatory Visit: Payer: Medicare Other | Admitting: Nutrition

## 2016-11-10 DIAGNOSIS — J449 Chronic obstructive pulmonary disease, unspecified: Secondary | ICD-10-CM | POA: Diagnosis not present

## 2016-11-10 DIAGNOSIS — G4733 Obstructive sleep apnea (adult) (pediatric): Secondary | ICD-10-CM | POA: Diagnosis not present

## 2016-11-10 DIAGNOSIS — Z79891 Long term (current) use of opiate analgesic: Secondary | ICD-10-CM | POA: Diagnosis not present

## 2016-11-10 DIAGNOSIS — E1142 Type 2 diabetes mellitus with diabetic polyneuropathy: Secondary | ICD-10-CM | POA: Diagnosis not present

## 2016-11-10 DIAGNOSIS — I700 Atherosclerosis of aorta: Secondary | ICD-10-CM | POA: Diagnosis not present

## 2016-11-10 DIAGNOSIS — Z1212 Encounter for screening for malignant neoplasm of rectum: Secondary | ICD-10-CM | POA: Diagnosis not present

## 2016-11-10 DIAGNOSIS — I6523 Occlusion and stenosis of bilateral carotid arteries: Secondary | ICD-10-CM | POA: Diagnosis not present

## 2016-11-10 DIAGNOSIS — G6289 Other specified polyneuropathies: Secondary | ICD-10-CM | POA: Diagnosis not present

## 2016-11-21 ENCOUNTER — Encounter: Payer: Medicare Other | Attending: Family Medicine | Admitting: Nutrition

## 2016-11-21 VITALS — Ht 64.0 in | Wt 166.0 lb

## 2016-11-21 DIAGNOSIS — Z713 Dietary counseling and surveillance: Secondary | ICD-10-CM | POA: Diagnosis not present

## 2016-11-21 DIAGNOSIS — E119 Type 2 diabetes mellitus without complications: Secondary | ICD-10-CM | POA: Diagnosis not present

## 2016-11-21 DIAGNOSIS — I10 Essential (primary) hypertension: Secondary | ICD-10-CM | POA: Diagnosis not present

## 2016-11-21 DIAGNOSIS — E7800 Pure hypercholesterolemia, unspecified: Secondary | ICD-10-CM | POA: Diagnosis not present

## 2016-11-21 NOTE — Progress Notes (Signed)
Diabetes Self-Management Education  Visit Type:   Follow up  Appt. Start Time: 1330 Appt. End Time: 1400  11/21/2016  [name], identified by name and date of birth, is a 83 y.o. female with a diagnosis of Diabetes:  .  Marland KitchenChanges: Has cut out snacks. Is eating three meals per day instead of skipping.. Eating more salads and more vegetables.  Drinking more water.  Physical actvity: hasn't been able much A1C down to 6.4% from 6.5%.  She is still eating some simple sugared cereals and fried foods at times. Needs to increase low carb vegetables and fresh fruits,  more water on more of a consistent basis.Marland Kitchen No changes in weight. She does admit to sometimes feeling light headed or shaky at times but doesn't check her BS. Advised to check BS at those times. Reviewed hypoglycemia treatment. Stressed need for scheduled times meals.   Wt Readings from Last 3 Encounters:  11/21/16 166 lb (75.3 kg)  10/10/16 166 lb (75.3 kg)   Ht Readings from Last 3 Encounters:  11/21/16 5\' 4"  (1.626 m)  10/10/16 5\' 4"  (1.626 m)   Body mass index is 28.49 kg/m. @BMIFA @ Facility age limit for growth percentiles is 20 years. Facility age limit for growth percentiles is 20 years.    ASSESSMENT  Height 5\' 4"  (1.626 m), weight 166 lb (75.3 kg). Body mass index is 28.49 kg/m.     Individualized Plan for Diabetes Self-Management Training:   Learning Objective:  Patient will have a greater understanding of diabetes self-management. Patient education plan is to attend individual and/or group sessions per assessed needs and concerns.    B)  Frosted flakes with 2% milk or oatmeal, coffee with hazelnut creamer SF L) Okra, cauliflower, pinto beans and cabbage, water D)  Fried chicken livers,  Slaw, SF Tea/lemonade  Plan:  Goal 1. Rejoin Silver Sneakers 2.  Cut out sugared cereal for breakfast 3. Increase more fresh fruits and vegetables 4. Drink 2 bottles of water per day. Test blood sugar if  symptoms of low blood sugar and let Dr. Quillian Larsen now.  Expected Outcomes:     Improved blood sugars and less complications. Improved knowledge of DM and prevention of problems.  Education material provided: Living Well with Diabetes, Food label handouts, A1C conversion sheet, Meal plan card, My Plate and Carbohydrate counting sheet  If problems or questions, patient to contact team via:  Phone and Email  Future DSME appointment:  4 months.

## 2016-11-21 NOTE — Patient Instructions (Addendum)
Goal 1. Rejoin Silver Sneakers 2.  Cut out sugared cereal for breakfast 3. Increase more fresh fruits and vegetables 4. Drink 2 bottles of water per day. Test blood sugar if symptoms of low blood sugar and let Dr. Quillian Quince now.

## 2016-12-09 DIAGNOSIS — G4733 Obstructive sleep apnea (adult) (pediatric): Secondary | ICD-10-CM | POA: Diagnosis not present

## 2016-12-27 DIAGNOSIS — G4733 Obstructive sleep apnea (adult) (pediatric): Secondary | ICD-10-CM | POA: Diagnosis not present

## 2017-01-04 DIAGNOSIS — Z23 Encounter for immunization: Secondary | ICD-10-CM | POA: Diagnosis not present

## 2017-01-08 DIAGNOSIS — G4733 Obstructive sleep apnea (adult) (pediatric): Secondary | ICD-10-CM | POA: Diagnosis not present

## 2017-01-23 DIAGNOSIS — G4733 Obstructive sleep apnea (adult) (pediatric): Secondary | ICD-10-CM | POA: Diagnosis not present

## 2017-02-08 DIAGNOSIS — R0902 Hypoxemia: Secondary | ICD-10-CM | POA: Diagnosis not present

## 2017-02-14 DIAGNOSIS — E1140 Type 2 diabetes mellitus with diabetic neuropathy, unspecified: Secondary | ICD-10-CM | POA: Diagnosis not present

## 2017-02-14 DIAGNOSIS — E119 Type 2 diabetes mellitus without complications: Secondary | ICD-10-CM | POA: Diagnosis not present

## 2017-02-14 DIAGNOSIS — E7800 Pure hypercholesterolemia, unspecified: Secondary | ICD-10-CM | POA: Diagnosis not present

## 2017-02-14 DIAGNOSIS — I10 Essential (primary) hypertension: Secondary | ICD-10-CM | POA: Diagnosis not present

## 2017-02-22 DIAGNOSIS — E1142 Type 2 diabetes mellitus with diabetic polyneuropathy: Secondary | ICD-10-CM | POA: Diagnosis not present

## 2017-02-22 DIAGNOSIS — E1165 Type 2 diabetes mellitus with hyperglycemia: Secondary | ICD-10-CM | POA: Diagnosis not present

## 2017-02-22 DIAGNOSIS — G4733 Obstructive sleep apnea (adult) (pediatric): Secondary | ICD-10-CM | POA: Diagnosis not present

## 2017-02-22 DIAGNOSIS — E782 Mixed hyperlipidemia: Secondary | ICD-10-CM | POA: Diagnosis not present

## 2017-02-24 DIAGNOSIS — G4733 Obstructive sleep apnea (adult) (pediatric): Secondary | ICD-10-CM | POA: Diagnosis not present

## 2017-02-24 DIAGNOSIS — Z0001 Encounter for general adult medical examination with abnormal findings: Secondary | ICD-10-CM | POA: Diagnosis not present

## 2017-02-24 DIAGNOSIS — G6289 Other specified polyneuropathies: Secondary | ICD-10-CM | POA: Diagnosis not present

## 2017-02-24 DIAGNOSIS — Z23 Encounter for immunization: Secondary | ICD-10-CM | POA: Diagnosis not present

## 2017-03-10 DIAGNOSIS — R0902 Hypoxemia: Secondary | ICD-10-CM | POA: Diagnosis not present

## 2017-03-15 DIAGNOSIS — M1612 Unilateral primary osteoarthritis, left hip: Secondary | ICD-10-CM | POA: Diagnosis not present

## 2017-03-15 DIAGNOSIS — M545 Low back pain: Secondary | ICD-10-CM | POA: Diagnosis not present

## 2017-03-16 DIAGNOSIS — M25552 Pain in left hip: Secondary | ICD-10-CM | POA: Diagnosis not present

## 2017-03-22 ENCOUNTER — Ambulatory Visit: Payer: Medicare Other | Admitting: Nutrition

## 2017-04-03 DIAGNOSIS — G4733 Obstructive sleep apnea (adult) (pediatric): Secondary | ICD-10-CM | POA: Diagnosis not present

## 2017-04-10 DIAGNOSIS — G4733 Obstructive sleep apnea (adult) (pediatric): Secondary | ICD-10-CM | POA: Diagnosis not present

## 2017-04-21 DIAGNOSIS — R296 Repeated falls: Secondary | ICD-10-CM | POA: Diagnosis not present

## 2017-04-21 DIAGNOSIS — M25532 Pain in left wrist: Secondary | ICD-10-CM | POA: Diagnosis not present

## 2017-04-27 DIAGNOSIS — R296 Repeated falls: Secondary | ICD-10-CM | POA: Diagnosis not present

## 2017-04-27 DIAGNOSIS — M6281 Muscle weakness (generalized): Secondary | ICD-10-CM | POA: Diagnosis not present

## 2017-05-02 DIAGNOSIS — R296 Repeated falls: Secondary | ICD-10-CM | POA: Diagnosis not present

## 2017-05-02 DIAGNOSIS — M6281 Muscle weakness (generalized): Secondary | ICD-10-CM | POA: Diagnosis not present

## 2017-05-04 DIAGNOSIS — R296 Repeated falls: Secondary | ICD-10-CM | POA: Diagnosis not present

## 2017-05-04 DIAGNOSIS — M6281 Muscle weakness (generalized): Secondary | ICD-10-CM | POA: Diagnosis not present

## 2017-05-10 DIAGNOSIS — R296 Repeated falls: Secondary | ICD-10-CM | POA: Diagnosis not present

## 2017-05-10 DIAGNOSIS — M6281 Muscle weakness (generalized): Secondary | ICD-10-CM | POA: Diagnosis not present

## 2017-05-11 DIAGNOSIS — R296 Repeated falls: Secondary | ICD-10-CM | POA: Diagnosis not present

## 2017-05-11 DIAGNOSIS — G4733 Obstructive sleep apnea (adult) (pediatric): Secondary | ICD-10-CM | POA: Diagnosis not present

## 2017-05-11 DIAGNOSIS — M6281 Muscle weakness (generalized): Secondary | ICD-10-CM | POA: Diagnosis not present

## 2017-05-15 DIAGNOSIS — C44212 Basal cell carcinoma of skin of right ear and external auricular canal: Secondary | ICD-10-CM | POA: Diagnosis not present

## 2017-05-17 DIAGNOSIS — M6281 Muscle weakness (generalized): Secondary | ICD-10-CM | POA: Diagnosis not present

## 2017-05-17 DIAGNOSIS — R296 Repeated falls: Secondary | ICD-10-CM | POA: Diagnosis not present

## 2017-05-18 DIAGNOSIS — R296 Repeated falls: Secondary | ICD-10-CM | POA: Diagnosis not present

## 2017-05-18 DIAGNOSIS — M6281 Muscle weakness (generalized): Secondary | ICD-10-CM | POA: Diagnosis not present

## 2017-05-30 DIAGNOSIS — M6281 Muscle weakness (generalized): Secondary | ICD-10-CM | POA: Diagnosis not present

## 2017-05-30 DIAGNOSIS — R296 Repeated falls: Secondary | ICD-10-CM | POA: Diagnosis not present

## 2017-06-01 DIAGNOSIS — R296 Repeated falls: Secondary | ICD-10-CM | POA: Diagnosis not present

## 2017-06-01 DIAGNOSIS — M6281 Muscle weakness (generalized): Secondary | ICD-10-CM | POA: Diagnosis not present

## 2017-06-06 DIAGNOSIS — R296 Repeated falls: Secondary | ICD-10-CM | POA: Diagnosis not present

## 2017-06-06 DIAGNOSIS — M6281 Muscle weakness (generalized): Secondary | ICD-10-CM | POA: Diagnosis not present

## 2017-06-08 DIAGNOSIS — Z13220 Encounter for screening for lipoid disorders: Secondary | ICD-10-CM | POA: Diagnosis not present

## 2017-06-08 DIAGNOSIS — R296 Repeated falls: Secondary | ICD-10-CM | POA: Diagnosis not present

## 2017-06-08 DIAGNOSIS — Z1331 Encounter for screening for depression: Secondary | ICD-10-CM | POA: Diagnosis not present

## 2017-06-08 DIAGNOSIS — J449 Chronic obstructive pulmonary disease, unspecified: Secondary | ICD-10-CM | POA: Diagnosis not present

## 2017-06-08 DIAGNOSIS — M6281 Muscle weakness (generalized): Secondary | ICD-10-CM | POA: Diagnosis not present

## 2017-06-08 DIAGNOSIS — E782 Mixed hyperlipidemia: Secondary | ICD-10-CM | POA: Diagnosis not present

## 2017-06-08 DIAGNOSIS — Z1389 Encounter for screening for other disorder: Secondary | ICD-10-CM | POA: Diagnosis not present

## 2017-06-08 DIAGNOSIS — I6523 Occlusion and stenosis of bilateral carotid arteries: Secondary | ICD-10-CM | POA: Diagnosis not present

## 2017-06-08 DIAGNOSIS — R0902 Hypoxemia: Secondary | ICD-10-CM | POA: Diagnosis not present

## 2017-06-08 DIAGNOSIS — E1142 Type 2 diabetes mellitus with diabetic polyneuropathy: Secondary | ICD-10-CM | POA: Diagnosis not present

## 2017-07-09 DIAGNOSIS — R0902 Hypoxemia: Secondary | ICD-10-CM | POA: Diagnosis not present

## 2017-07-09 DIAGNOSIS — G4733 Obstructive sleep apnea (adult) (pediatric): Secondary | ICD-10-CM | POA: Diagnosis not present

## 2017-08-08 DIAGNOSIS — R0902 Hypoxemia: Secondary | ICD-10-CM | POA: Diagnosis not present

## 2017-08-28 DIAGNOSIS — M1612 Unilateral primary osteoarthritis, left hip: Secondary | ICD-10-CM | POA: Diagnosis not present

## 2017-09-05 DIAGNOSIS — E782 Mixed hyperlipidemia: Secondary | ICD-10-CM | POA: Diagnosis not present

## 2017-09-05 DIAGNOSIS — E1142 Type 2 diabetes mellitus with diabetic polyneuropathy: Secondary | ICD-10-CM | POA: Diagnosis not present

## 2017-09-05 DIAGNOSIS — I10 Essential (primary) hypertension: Secondary | ICD-10-CM | POA: Diagnosis not present

## 2017-09-05 DIAGNOSIS — E1165 Type 2 diabetes mellitus with hyperglycemia: Secondary | ICD-10-CM | POA: Diagnosis not present

## 2017-09-07 DIAGNOSIS — I6523 Occlusion and stenosis of bilateral carotid arteries: Secondary | ICD-10-CM | POA: Diagnosis not present

## 2017-09-07 DIAGNOSIS — Z0001 Encounter for general adult medical examination with abnormal findings: Secondary | ICD-10-CM | POA: Diagnosis not present

## 2017-09-07 DIAGNOSIS — G6289 Other specified polyneuropathies: Secondary | ICD-10-CM | POA: Diagnosis not present

## 2017-09-07 DIAGNOSIS — Z1389 Encounter for screening for other disorder: Secondary | ICD-10-CM | POA: Diagnosis not present

## 2017-09-07 DIAGNOSIS — E1142 Type 2 diabetes mellitus with diabetic polyneuropathy: Secondary | ICD-10-CM | POA: Diagnosis not present

## 2017-09-07 DIAGNOSIS — Z79891 Long term (current) use of opiate analgesic: Secondary | ICD-10-CM | POA: Diagnosis not present

## 2017-09-07 DIAGNOSIS — G4733 Obstructive sleep apnea (adult) (pediatric): Secondary | ICD-10-CM | POA: Diagnosis not present

## 2017-09-07 DIAGNOSIS — I700 Atherosclerosis of aorta: Secondary | ICD-10-CM | POA: Diagnosis not present

## 2017-09-08 DIAGNOSIS — R0902 Hypoxemia: Secondary | ICD-10-CM | POA: Diagnosis not present

## 2017-09-18 DIAGNOSIS — M1612 Unilateral primary osteoarthritis, left hip: Secondary | ICD-10-CM | POA: Diagnosis not present

## 2017-09-18 DIAGNOSIS — M25552 Pain in left hip: Secondary | ICD-10-CM | POA: Diagnosis not present

## 2017-10-08 DIAGNOSIS — R0902 Hypoxemia: Secondary | ICD-10-CM | POA: Diagnosis not present

## 2017-11-03 DIAGNOSIS — T148XXA Other injury of unspecified body region, initial encounter: Secondary | ICD-10-CM | POA: Diagnosis not present

## 2017-11-03 DIAGNOSIS — R233 Spontaneous ecchymoses: Secondary | ICD-10-CM | POA: Diagnosis not present

## 2017-11-03 DIAGNOSIS — L608 Other nail disorders: Secondary | ICD-10-CM | POA: Diagnosis not present

## 2017-11-08 DIAGNOSIS — R0902 Hypoxemia: Secondary | ICD-10-CM | POA: Diagnosis not present

## 2017-11-09 DIAGNOSIS — G4733 Obstructive sleep apnea (adult) (pediatric): Secondary | ICD-10-CM | POA: Diagnosis not present

## 2017-11-09 DIAGNOSIS — L84 Corns and callosities: Secondary | ICD-10-CM | POA: Diagnosis not present

## 2017-11-09 DIAGNOSIS — T148XXD Other injury of unspecified body region, subsequent encounter: Secondary | ICD-10-CM | POA: Diagnosis not present

## 2017-11-16 DIAGNOSIS — I10 Essential (primary) hypertension: Secondary | ICD-10-CM | POA: Diagnosis not present

## 2017-11-16 DIAGNOSIS — E782 Mixed hyperlipidemia: Secondary | ICD-10-CM | POA: Diagnosis not present

## 2017-11-29 DIAGNOSIS — L84 Corns and callosities: Secondary | ICD-10-CM | POA: Diagnosis not present

## 2017-11-29 DIAGNOSIS — T148XXD Other injury of unspecified body region, subsequent encounter: Secondary | ICD-10-CM | POA: Diagnosis not present

## 2017-12-09 DIAGNOSIS — R0902 Hypoxemia: Secondary | ICD-10-CM | POA: Diagnosis not present

## 2017-12-11 DIAGNOSIS — Z23 Encounter for immunization: Secondary | ICD-10-CM | POA: Diagnosis not present

## 2017-12-11 DIAGNOSIS — I700 Atherosclerosis of aorta: Secondary | ICD-10-CM | POA: Diagnosis not present

## 2017-12-11 DIAGNOSIS — E782 Mixed hyperlipidemia: Secondary | ICD-10-CM | POA: Diagnosis not present

## 2017-12-11 DIAGNOSIS — I6523 Occlusion and stenosis of bilateral carotid arteries: Secondary | ICD-10-CM | POA: Diagnosis not present

## 2017-12-11 DIAGNOSIS — G6289 Other specified polyneuropathies: Secondary | ICD-10-CM | POA: Diagnosis not present

## 2017-12-11 DIAGNOSIS — I10 Essential (primary) hypertension: Secondary | ICD-10-CM | POA: Diagnosis not present

## 2017-12-11 DIAGNOSIS — G252 Other specified forms of tremor: Secondary | ICD-10-CM | POA: Diagnosis not present

## 2017-12-14 DIAGNOSIS — E1140 Type 2 diabetes mellitus with diabetic neuropathy, unspecified: Secondary | ICD-10-CM | POA: Diagnosis not present

## 2017-12-14 DIAGNOSIS — L89893 Pressure ulcer of other site, stage 3: Secondary | ICD-10-CM | POA: Diagnosis not present

## 2017-12-14 DIAGNOSIS — L110 Acquired keratosis follicularis: Secondary | ICD-10-CM | POA: Diagnosis not present

## 2017-12-28 DIAGNOSIS — E1140 Type 2 diabetes mellitus with diabetic neuropathy, unspecified: Secondary | ICD-10-CM | POA: Diagnosis not present

## 2017-12-28 DIAGNOSIS — L110 Acquired keratosis follicularis: Secondary | ICD-10-CM | POA: Diagnosis not present

## 2017-12-28 DIAGNOSIS — L89893 Pressure ulcer of other site, stage 3: Secondary | ICD-10-CM | POA: Diagnosis not present

## 2018-01-08 DIAGNOSIS — R0902 Hypoxemia: Secondary | ICD-10-CM | POA: Diagnosis not present

## 2018-01-18 DIAGNOSIS — L89893 Pressure ulcer of other site, stage 3: Secondary | ICD-10-CM | POA: Diagnosis not present

## 2018-01-18 DIAGNOSIS — E1140 Type 2 diabetes mellitus with diabetic neuropathy, unspecified: Secondary | ICD-10-CM | POA: Diagnosis not present

## 2018-01-18 DIAGNOSIS — L110 Acquired keratosis follicularis: Secondary | ICD-10-CM | POA: Diagnosis not present

## 2018-02-08 DIAGNOSIS — R0902 Hypoxemia: Secondary | ICD-10-CM | POA: Diagnosis not present

## 2018-02-15 DIAGNOSIS — L89893 Pressure ulcer of other site, stage 3: Secondary | ICD-10-CM | POA: Diagnosis not present

## 2018-02-15 DIAGNOSIS — E1140 Type 2 diabetes mellitus with diabetic neuropathy, unspecified: Secondary | ICD-10-CM | POA: Diagnosis not present

## 2018-02-15 DIAGNOSIS — L110 Acquired keratosis follicularis: Secondary | ICD-10-CM | POA: Diagnosis not present

## 2018-02-26 DIAGNOSIS — G4733 Obstructive sleep apnea (adult) (pediatric): Secondary | ICD-10-CM | POA: Diagnosis not present

## 2018-02-26 DIAGNOSIS — E782 Mixed hyperlipidemia: Secondary | ICD-10-CM | POA: Diagnosis not present

## 2018-02-26 DIAGNOSIS — E1165 Type 2 diabetes mellitus with hyperglycemia: Secondary | ICD-10-CM | POA: Diagnosis not present

## 2018-02-26 DIAGNOSIS — E1142 Type 2 diabetes mellitus with diabetic polyneuropathy: Secondary | ICD-10-CM | POA: Diagnosis not present

## 2018-02-26 DIAGNOSIS — Z9189 Other specified personal risk factors, not elsewhere classified: Secondary | ICD-10-CM | POA: Diagnosis not present

## 2018-02-28 DIAGNOSIS — Z0001 Encounter for general adult medical examination with abnormal findings: Secondary | ICD-10-CM | POA: Diagnosis not present

## 2018-02-28 DIAGNOSIS — Z1389 Encounter for screening for other disorder: Secondary | ICD-10-CM | POA: Diagnosis not present

## 2018-03-10 DIAGNOSIS — R0902 Hypoxemia: Secondary | ICD-10-CM | POA: Diagnosis not present

## 2018-03-12 DIAGNOSIS — I6523 Occlusion and stenosis of bilateral carotid arteries: Secondary | ICD-10-CM | POA: Diagnosis not present

## 2018-03-12 DIAGNOSIS — G6289 Other specified polyneuropathies: Secondary | ICD-10-CM | POA: Diagnosis not present

## 2018-03-15 DIAGNOSIS — E1140 Type 2 diabetes mellitus with diabetic neuropathy, unspecified: Secondary | ICD-10-CM | POA: Diagnosis not present

## 2018-03-15 DIAGNOSIS — L89893 Pressure ulcer of other site, stage 3: Secondary | ICD-10-CM | POA: Diagnosis not present

## 2018-03-15 DIAGNOSIS — L110 Acquired keratosis follicularis: Secondary | ICD-10-CM | POA: Diagnosis not present

## 2018-04-10 DIAGNOSIS — R0902 Hypoxemia: Secondary | ICD-10-CM | POA: Diagnosis not present

## 2018-04-12 ENCOUNTER — Ambulatory Visit (INDEPENDENT_AMBULATORY_CARE_PROVIDER_SITE_OTHER): Payer: Medicare Other | Admitting: Orthopaedic Surgery

## 2018-04-12 ENCOUNTER — Ambulatory Visit (INDEPENDENT_AMBULATORY_CARE_PROVIDER_SITE_OTHER): Payer: Medicare Other

## 2018-04-12 ENCOUNTER — Encounter (INDEPENDENT_AMBULATORY_CARE_PROVIDER_SITE_OTHER): Payer: Self-pay | Admitting: Orthopaedic Surgery

## 2018-04-12 VITALS — BP 129/69 | HR 64 | Ht 64.0 in | Wt 160.0 lb

## 2018-04-12 DIAGNOSIS — J449 Chronic obstructive pulmonary disease, unspecified: Secondary | ICD-10-CM | POA: Diagnosis not present

## 2018-04-12 DIAGNOSIS — M25562 Pain in left knee: Secondary | ICD-10-CM

## 2018-04-12 DIAGNOSIS — I10 Essential (primary) hypertension: Secondary | ICD-10-CM | POA: Diagnosis not present

## 2018-04-12 DIAGNOSIS — E782 Mixed hyperlipidemia: Secondary | ICD-10-CM | POA: Diagnosis not present

## 2018-04-12 DIAGNOSIS — E1142 Type 2 diabetes mellitus with diabetic polyneuropathy: Secondary | ICD-10-CM | POA: Diagnosis not present

## 2018-04-12 MED ORDER — BUPIVACAINE HCL 0.5 % IJ SOLN
3.0000 mL | INTRAMUSCULAR | Status: AC | PRN
  Administered 2018-04-12: 3 mL via INTRA_ARTICULAR

## 2018-04-12 MED ORDER — METHYLPREDNISOLONE ACETATE 40 MG/ML IJ SUSP
40.0000 mg | INTRAMUSCULAR | Status: AC | PRN
  Administered 2018-04-12: 40 mg via INTRA_ARTICULAR

## 2018-04-12 MED ORDER — LIDOCAINE HCL 1 % IJ SOLN
0.5000 mL | INTRAMUSCULAR | Status: AC | PRN
  Administered 2018-04-12: .5 mL

## 2018-04-12 NOTE — Progress Notes (Signed)
Office Visit Note   Patient: Lisa Larsen           Date of Birth: [birth year]           MRN: [id number] Visit Date: 04/12/2018              Requested by: Caryl Bis, MD [place], [place] [id number] PCP: Redmond School, MD   Assessment & Plan: Visit Diagnoses:  1. Acute pain of left knee     Plan: Knee injection performed with good relief of pain.  Follow-Up Instructions: No follow-ups on file.   Orders:  Orders Placed This Encounter  Procedures  . XR KNEE 3 VIEW LEFT   No orders of the defined types were placed in this encounter.     Procedures: Large Joint Inj: L knee on 04/12/2018 4:02 PM Indications: joint swelling and pain Details: 22 G 1.5 in needle, anterolateral approach  Arthrogram: No  Medications: 0.5 mL lidocaine 1 %; 3 mL bupivacaine 0.5 %; 40 mg methylPREDNISolone acetate 40 MG/ML Outcome: tolerated well, no immediate complications Procedure, treatment alternatives, risks and benefits explained, specific risks discussed. Consent was given by the patient. Immediately prior to procedure a time out was called to verify the correct patient, procedure, equipment, support staff and site/side marked as required. Patient was prepped and draped in the usual sterile fashion.       Clinical Data: No additional findings.   Subjective: Chief Complaint  Patient presents with  . Left Knee - Pain    HPI 85 year old female seen with left knee pain which she rates as 8 out of 10 sharp achy is been present for 3 to 4 months without relief.  She is used icy hot Tylenol brace without relief she does have diabetes last A1c was 6.7.  She is on metformin.  Patient recently started some Percocet for knee pain.  Denies chills or fever she has a brother with gout.  Review of Systems previous heel spur surgery 1990s.  Positive for diabetes on metformin.  Hypertension, sleep apnea.  Past 1/2 pack/day smoker now non-smoker.  Otherwise 14 point systems negative  is a pertains HPI.   Objective: Vital Signs: BP 129/69   Pulse 64   Ht 5\' 4"  (1.626 m)   Wt 160 lb (72.6 kg)   BMI 27.46 kg/m   Physical Exam Constitutional:      Appearance: She is well-developed.  HENT:     Head: Normocephalic.     Right Ear: External ear normal.     Left Ear: External ear normal.  Eyes:     Pupils: Pupils are equal, round, and reactive to light.  Neck:     Thyroid: No thyromegaly.     Trachea: No tracheal deviation.  Cardiovascular:     Rate and Rhythm: Normal rate.  Pulmonary:     Effort: Pulmonary effort is normal.  Abdominal:     Palpations: Abdomen is soft.  Skin:    General: Skin is warm and dry.  Neurological:     Mental Status: She is alert and oriented to person, place, and time.  Psychiatric:        Behavior: Behavior normal.     Ortho Exam patient ambulates with a pronounced limp she has crepitus with range of motion mild pop which is not particularly painful.  Pain with patellofemoral loading medial lateral joint line tenderness 2+ synovitis present distal pulses are intact negative logroll the hips negative popliteal compression test no  Baker's cyst.  Tib-fib joint pes bursa is normal.  Normal patellar tendon.  ACL PCL collateral ligament exam is normal.  Specialty Comments:  No specialty comments available.  Imaging: No results found.   PMFS History: There are no active problems to display for this patient.  Past Medical History:  Diagnosis Date  . Diabetes ([place])   . Hypertension   . Sleep apnea     No family history on file.  Past Surgical History:  Procedure Laterality Date  . FOOT SURGERY     heel surgery 1990s   Social History   Occupational History  . Not on file  Tobacco Use  . Smoking status: Former Research scientist (life sciences)  . Smokeless tobacco: Never Used  . Tobacco comment: quit 30 years ago  Substance and Sexual Activity  . Alcohol use: Not Currently  . Drug use: Not on file  . Sexual activity: Not on file

## 2018-05-07 DIAGNOSIS — G4733 Obstructive sleep apnea (adult) (pediatric): Secondary | ICD-10-CM | POA: Diagnosis not present

## 2018-05-11 DIAGNOSIS — R0902 Hypoxemia: Secondary | ICD-10-CM | POA: Diagnosis not present

## 2018-05-11 DIAGNOSIS — I10 Essential (primary) hypertension: Secondary | ICD-10-CM | POA: Diagnosis not present

## 2018-05-11 DIAGNOSIS — E782 Mixed hyperlipidemia: Secondary | ICD-10-CM | POA: Diagnosis not present

## 2018-05-21 DIAGNOSIS — E1140 Type 2 diabetes mellitus with diabetic neuropathy, unspecified: Secondary | ICD-10-CM | POA: Diagnosis not present

## 2018-05-21 DIAGNOSIS — L89893 Pressure ulcer of other site, stage 3: Secondary | ICD-10-CM | POA: Diagnosis not present

## 2018-05-21 DIAGNOSIS — L110 Acquired keratosis follicularis: Secondary | ICD-10-CM | POA: Diagnosis not present

## 2018-05-30 DIAGNOSIS — M25531 Pain in right wrist: Secondary | ICD-10-CM | POA: Diagnosis not present

## 2018-05-30 DIAGNOSIS — R509 Fever, unspecified: Secondary | ICD-10-CM | POA: Diagnosis not present

## 2018-06-09 DIAGNOSIS — R0902 Hypoxemia: Secondary | ICD-10-CM | POA: Diagnosis not present

## 2018-06-11 DIAGNOSIS — L89893 Pressure ulcer of other site, stage 3: Secondary | ICD-10-CM | POA: Diagnosis not present

## 2018-06-11 DIAGNOSIS — L110 Acquired keratosis follicularis: Secondary | ICD-10-CM | POA: Diagnosis not present

## 2018-06-11 DIAGNOSIS — E1140 Type 2 diabetes mellitus with diabetic neuropathy, unspecified: Secondary | ICD-10-CM | POA: Diagnosis not present

## 2018-06-29 DIAGNOSIS — M25512 Pain in left shoulder: Secondary | ICD-10-CM | POA: Diagnosis not present

## 2018-07-10 DIAGNOSIS — R0902 Hypoxemia: Secondary | ICD-10-CM | POA: Diagnosis not present

## 2018-07-11 DIAGNOSIS — L110 Acquired keratosis follicularis: Secondary | ICD-10-CM | POA: Diagnosis not present

## 2018-07-11 DIAGNOSIS — E1140 Type 2 diabetes mellitus with diabetic neuropathy, unspecified: Secondary | ICD-10-CM | POA: Diagnosis not present

## 2018-07-11 DIAGNOSIS — L89893 Pressure ulcer of other site, stage 3: Secondary | ICD-10-CM | POA: Diagnosis not present

## 2018-08-09 DIAGNOSIS — R0902 Hypoxemia: Secondary | ICD-10-CM | POA: Diagnosis not present

## 2018-08-11 DIAGNOSIS — I10 Essential (primary) hypertension: Secondary | ICD-10-CM | POA: Diagnosis not present

## 2018-08-11 DIAGNOSIS — E782 Mixed hyperlipidemia: Secondary | ICD-10-CM | POA: Diagnosis not present

## 2018-08-22 ENCOUNTER — Ambulatory Visit (HOSPITAL_COMMUNITY)
Admission: RE | Admit: 2018-08-22 | Discharge: 2018-08-22 | Disposition: A | Discharge status: Home / Self Care | Payer: Medicare Other | Source: Ambulatory Visit | Attending: Physician Assistant | Admitting: Physician Assistant

## 2018-08-22 ENCOUNTER — Other Ambulatory Visit: Payer: Self-pay

## 2018-08-22 ENCOUNTER — Other Ambulatory Visit (HOSPITAL_COMMUNITY): Payer: Self-pay | Admitting: Physician Assistant

## 2018-08-22 ENCOUNTER — Encounter (HOSPITAL_BASED_OUTPATIENT_CLINIC_OR_DEPARTMENT_OTHER): Payer: Medicare Other | Attending: Physician Assistant

## 2018-08-22 ENCOUNTER — Other Ambulatory Visit (HOSPITAL_COMMUNITY): Payer: Self-pay | Admitting: *Deleted

## 2018-08-22 DIAGNOSIS — Z7984 Long term (current) use of oral hypoglycemic drugs: Secondary | ICD-10-CM | POA: Diagnosis not present

## 2018-08-22 DIAGNOSIS — L97412 Non-pressure chronic ulcer of right heel and midfoot with fat layer exposed: Secondary | ICD-10-CM | POA: Insufficient documentation

## 2018-08-22 DIAGNOSIS — J449 Chronic obstructive pulmonary disease, unspecified: Secondary | ICD-10-CM | POA: Diagnosis not present

## 2018-08-22 DIAGNOSIS — E1140 Type 2 diabetes mellitus with diabetic neuropathy, unspecified: Secondary | ICD-10-CM | POA: Diagnosis not present

## 2018-08-22 DIAGNOSIS — E11621 Type 2 diabetes mellitus with foot ulcer: Secondary | ICD-10-CM | POA: Diagnosis not present

## 2018-08-22 DIAGNOSIS — I10 Essential (primary) hypertension: Secondary | ICD-10-CM | POA: Insufficient documentation

## 2018-08-22 DIAGNOSIS — L97512 Non-pressure chronic ulcer of other part of right foot with fat layer exposed: Secondary | ICD-10-CM | POA: Diagnosis not present

## 2018-08-22 DIAGNOSIS — Z87891 Personal history of nicotine dependence: Secondary | ICD-10-CM | POA: Insufficient documentation

## 2018-08-22 DIAGNOSIS — E1121 Type 2 diabetes mellitus with diabetic nephropathy: Secondary | ICD-10-CM | POA: Diagnosis not present

## 2018-08-22 DIAGNOSIS — G4730 Sleep apnea, unspecified: Secondary | ICD-10-CM | POA: Insufficient documentation

## 2018-08-22 DIAGNOSIS — L97511 Non-pressure chronic ulcer of other part of right foot limited to breakdown of skin: Secondary | ICD-10-CM | POA: Diagnosis not present

## 2018-08-23 DIAGNOSIS — E11621 Type 2 diabetes mellitus with foot ulcer: Secondary | ICD-10-CM | POA: Diagnosis not present

## 2018-08-29 DIAGNOSIS — I10 Essential (primary) hypertension: Secondary | ICD-10-CM | POA: Diagnosis not present

## 2018-08-29 DIAGNOSIS — E1140 Type 2 diabetes mellitus with diabetic neuropathy, unspecified: Secondary | ICD-10-CM | POA: Diagnosis not present

## 2018-08-29 DIAGNOSIS — Z7984 Long term (current) use of oral hypoglycemic drugs: Secondary | ICD-10-CM | POA: Diagnosis not present

## 2018-08-29 DIAGNOSIS — L97512 Non-pressure chronic ulcer of other part of right foot with fat layer exposed: Secondary | ICD-10-CM | POA: Diagnosis not present

## 2018-08-29 DIAGNOSIS — L97412 Non-pressure chronic ulcer of right heel and midfoot with fat layer exposed: Secondary | ICD-10-CM | POA: Diagnosis not present

## 2018-08-29 DIAGNOSIS — Z87891 Personal history of nicotine dependence: Secondary | ICD-10-CM | POA: Diagnosis not present

## 2018-08-29 DIAGNOSIS — J449 Chronic obstructive pulmonary disease, unspecified: Secondary | ICD-10-CM | POA: Diagnosis not present

## 2018-08-29 DIAGNOSIS — G4730 Sleep apnea, unspecified: Secondary | ICD-10-CM | POA: Diagnosis not present

## 2018-08-29 DIAGNOSIS — E11621 Type 2 diabetes mellitus with foot ulcer: Secondary | ICD-10-CM | POA: Diagnosis not present

## 2018-08-29 DIAGNOSIS — E1121 Type 2 diabetes mellitus with diabetic nephropathy: Secondary | ICD-10-CM | POA: Diagnosis not present

## 2018-08-31 DIAGNOSIS — E1121 Type 2 diabetes mellitus with diabetic nephropathy: Secondary | ICD-10-CM | POA: Diagnosis not present

## 2018-08-31 DIAGNOSIS — E1140 Type 2 diabetes mellitus with diabetic neuropathy, unspecified: Secondary | ICD-10-CM | POA: Diagnosis not present

## 2018-08-31 DIAGNOSIS — J449 Chronic obstructive pulmonary disease, unspecified: Secondary | ICD-10-CM | POA: Diagnosis not present

## 2018-08-31 DIAGNOSIS — I10 Essential (primary) hypertension: Secondary | ICD-10-CM | POA: Diagnosis not present

## 2018-08-31 DIAGNOSIS — G4730 Sleep apnea, unspecified: Secondary | ICD-10-CM | POA: Diagnosis not present

## 2018-08-31 DIAGNOSIS — Z7984 Long term (current) use of oral hypoglycemic drugs: Secondary | ICD-10-CM | POA: Diagnosis not present

## 2018-08-31 DIAGNOSIS — Z87891 Personal history of nicotine dependence: Secondary | ICD-10-CM | POA: Diagnosis not present

## 2018-08-31 DIAGNOSIS — E11621 Type 2 diabetes mellitus with foot ulcer: Secondary | ICD-10-CM | POA: Diagnosis not present

## 2018-08-31 DIAGNOSIS — L97512 Non-pressure chronic ulcer of other part of right foot with fat layer exposed: Secondary | ICD-10-CM | POA: Diagnosis not present

## 2018-08-31 DIAGNOSIS — L97412 Non-pressure chronic ulcer of right heel and midfoot with fat layer exposed: Secondary | ICD-10-CM | POA: Diagnosis not present

## 2018-09-05 DIAGNOSIS — Z87891 Personal history of nicotine dependence: Secondary | ICD-10-CM | POA: Diagnosis not present

## 2018-09-05 DIAGNOSIS — L97512 Non-pressure chronic ulcer of other part of right foot with fat layer exposed: Secondary | ICD-10-CM | POA: Diagnosis not present

## 2018-09-05 DIAGNOSIS — I10 Essential (primary) hypertension: Secondary | ICD-10-CM | POA: Diagnosis not present

## 2018-09-05 DIAGNOSIS — Z7984 Long term (current) use of oral hypoglycemic drugs: Secondary | ICD-10-CM | POA: Diagnosis not present

## 2018-09-05 DIAGNOSIS — J449 Chronic obstructive pulmonary disease, unspecified: Secondary | ICD-10-CM | POA: Diagnosis not present

## 2018-09-05 DIAGNOSIS — E11621 Type 2 diabetes mellitus with foot ulcer: Secondary | ICD-10-CM | POA: Diagnosis not present

## 2018-09-05 DIAGNOSIS — E1140 Type 2 diabetes mellitus with diabetic neuropathy, unspecified: Secondary | ICD-10-CM | POA: Diagnosis not present

## 2018-09-05 DIAGNOSIS — G4730 Sleep apnea, unspecified: Secondary | ICD-10-CM | POA: Diagnosis not present

## 2018-09-05 DIAGNOSIS — E1121 Type 2 diabetes mellitus with diabetic nephropathy: Secondary | ICD-10-CM | POA: Diagnosis not present

## 2018-09-05 DIAGNOSIS — L97412 Non-pressure chronic ulcer of right heel and midfoot with fat layer exposed: Secondary | ICD-10-CM | POA: Diagnosis not present

## 2018-09-09 DIAGNOSIS — R0902 Hypoxemia: Secondary | ICD-10-CM | POA: Diagnosis not present

## 2018-09-11 DIAGNOSIS — I10 Essential (primary) hypertension: Secondary | ICD-10-CM | POA: Diagnosis not present

## 2018-09-11 DIAGNOSIS — E782 Mixed hyperlipidemia: Secondary | ICD-10-CM | POA: Diagnosis not present

## 2018-09-12 ENCOUNTER — Other Ambulatory Visit (HOSPITAL_COMMUNITY)
Admission: RE | Admit: 2018-09-12 | Discharge: 2018-09-12 | Disposition: A | Discharge status: Home / Self Care | Payer: Medicare Other | Source: Other Acute Inpatient Hospital | Attending: Physician Assistant | Admitting: Physician Assistant

## 2018-09-12 ENCOUNTER — Encounter (HOSPITAL_BASED_OUTPATIENT_CLINIC_OR_DEPARTMENT_OTHER): Payer: Medicare Other | Attending: Internal Medicine

## 2018-09-12 ENCOUNTER — Other Ambulatory Visit: Payer: Self-pay

## 2018-09-12 DIAGNOSIS — E11621 Type 2 diabetes mellitus with foot ulcer: Secondary | ICD-10-CM | POA: Diagnosis not present

## 2018-09-12 DIAGNOSIS — L97512 Non-pressure chronic ulcer of other part of right foot with fat layer exposed: Secondary | ICD-10-CM | POA: Insufficient documentation

## 2018-09-12 DIAGNOSIS — J449 Chronic obstructive pulmonary disease, unspecified: Secondary | ICD-10-CM | POA: Diagnosis not present

## 2018-09-12 DIAGNOSIS — G4730 Sleep apnea, unspecified: Secondary | ICD-10-CM | POA: Insufficient documentation

## 2018-09-12 DIAGNOSIS — E1121 Type 2 diabetes mellitus with diabetic nephropathy: Secondary | ICD-10-CM | POA: Insufficient documentation

## 2018-09-12 DIAGNOSIS — E1140 Type 2 diabetes mellitus with diabetic neuropathy, unspecified: Secondary | ICD-10-CM | POA: Insufficient documentation

## 2018-09-12 DIAGNOSIS — I10 Essential (primary) hypertension: Secondary | ICD-10-CM | POA: Diagnosis not present

## 2018-09-16 LAB — AEROBIC CULTURE? (SUPERFICIAL SPECIMEN): Culture: NORMAL

## 2018-09-16 LAB — AEROBIC CULTURE W GRAM STAIN (SUPERFICIAL SPECIMEN)

## 2018-09-19 DIAGNOSIS — I10 Essential (primary) hypertension: Secondary | ICD-10-CM | POA: Diagnosis not present

## 2018-09-19 DIAGNOSIS — Z23 Encounter for immunization: Secondary | ICD-10-CM | POA: Diagnosis not present

## 2018-09-19 DIAGNOSIS — E119 Type 2 diabetes mellitus without complications: Secondary | ICD-10-CM | POA: Diagnosis not present

## 2018-09-19 DIAGNOSIS — Z7984 Long term (current) use of oral hypoglycemic drugs: Secondary | ICD-10-CM | POA: Diagnosis not present

## 2018-09-19 DIAGNOSIS — Z7982 Long term (current) use of aspirin: Secondary | ICD-10-CM | POA: Diagnosis not present

## 2018-09-19 DIAGNOSIS — J449 Chronic obstructive pulmonary disease, unspecified: Secondary | ICD-10-CM | POA: Diagnosis not present

## 2018-09-19 DIAGNOSIS — S61411A Laceration without foreign body of right hand, initial encounter: Secondary | ICD-10-CM | POA: Diagnosis not present

## 2018-09-19 DIAGNOSIS — W1839XA Other fall on same level, initial encounter: Secondary | ICD-10-CM | POA: Diagnosis not present

## 2018-09-19 DIAGNOSIS — E7800 Pure hypercholesterolemia, unspecified: Secondary | ICD-10-CM | POA: Diagnosis not present

## 2018-09-19 DIAGNOSIS — Z79899 Other long term (current) drug therapy: Secondary | ICD-10-CM | POA: Diagnosis not present

## 2018-09-20 ENCOUNTER — Observation Stay: Admission: AD | Admit: 2018-09-20 | Payer: Medicare Other | Admitting: Internal Medicine

## 2018-09-20 DIAGNOSIS — G4733 Obstructive sleep apnea (adult) (pediatric): Secondary | ICD-10-CM | POA: Diagnosis not present

## 2018-09-20 DIAGNOSIS — I249 Acute ischemic heart disease, unspecified: Secondary | ICD-10-CM | POA: Diagnosis not present

## 2018-09-20 DIAGNOSIS — R918 Other nonspecific abnormal finding of lung field: Secondary | ICD-10-CM | POA: Diagnosis not present

## 2018-09-20 DIAGNOSIS — Z01810 Encounter for preprocedural cardiovascular examination: Secondary | ICD-10-CM | POA: Diagnosis not present

## 2018-09-20 DIAGNOSIS — Z7984 Long term (current) use of oral hypoglycemic drugs: Secondary | ICD-10-CM | POA: Diagnosis not present

## 2018-09-20 DIAGNOSIS — I214 Non-ST elevation (NSTEMI) myocardial infarction: Secondary | ICD-10-CM | POA: Diagnosis not present

## 2018-09-20 DIAGNOSIS — I2510 Atherosclerotic heart disease of native coronary artery without angina pectoris: Secondary | ICD-10-CM | POA: Diagnosis not present

## 2018-09-20 DIAGNOSIS — E119 Type 2 diabetes mellitus without complications: Secondary | ICD-10-CM | POA: Diagnosis not present

## 2018-09-20 DIAGNOSIS — R112 Nausea with vomiting, unspecified: Secondary | ICD-10-CM | POA: Diagnosis not present

## 2018-09-20 DIAGNOSIS — J449 Chronic obstructive pulmonary disease, unspecified: Secondary | ICD-10-CM | POA: Diagnosis not present

## 2018-09-20 DIAGNOSIS — S61419A Laceration without foreign body of unspecified hand, initial encounter: Secondary | ICD-10-CM | POA: Diagnosis not present

## 2018-09-20 DIAGNOSIS — I4949 Other premature depolarization: Secondary | ICD-10-CM | POA: Diagnosis not present

## 2018-09-20 DIAGNOSIS — Z1159 Encounter for screening for other viral diseases: Secondary | ICD-10-CM | POA: Diagnosis not present

## 2018-09-20 DIAGNOSIS — R079 Chest pain, unspecified: Secondary | ICD-10-CM | POA: Diagnosis not present

## 2018-09-20 DIAGNOSIS — R1110 Vomiting, unspecified: Secondary | ICD-10-CM | POA: Diagnosis not present

## 2018-09-20 DIAGNOSIS — R7989 Other specified abnormal findings of blood chemistry: Secondary | ICD-10-CM | POA: Diagnosis not present

## 2018-09-20 DIAGNOSIS — S0990XA Unspecified injury of head, initial encounter: Secondary | ICD-10-CM | POA: Diagnosis not present

## 2018-09-20 DIAGNOSIS — I5181 Takotsubo syndrome: Secondary | ICD-10-CM | POA: Diagnosis not present

## 2018-09-20 DIAGNOSIS — I499 Cardiac arrhythmia, unspecified: Secondary | ICD-10-CM | POA: Diagnosis not present

## 2018-09-20 DIAGNOSIS — E7800 Pure hypercholesterolemia, unspecified: Secondary | ICD-10-CM | POA: Diagnosis not present

## 2018-09-20 DIAGNOSIS — I10 Essential (primary) hypertension: Secondary | ICD-10-CM | POA: Diagnosis not present

## 2018-09-20 DIAGNOSIS — R1084 Generalized abdominal pain: Secondary | ICD-10-CM | POA: Diagnosis not present

## 2018-09-20 DIAGNOSIS — E1142 Type 2 diabetes mellitus with diabetic polyneuropathy: Secondary | ICD-10-CM | POA: Diagnosis not present

## 2018-09-20 DIAGNOSIS — S42021A Displaced fracture of shaft of right clavicle, initial encounter for closed fracture: Secondary | ICD-10-CM | POA: Diagnosis not present

## 2018-09-21 DIAGNOSIS — Z01810 Encounter for preprocedural cardiovascular examination: Secondary | ICD-10-CM | POA: Diagnosis not present

## 2018-09-21 DIAGNOSIS — I499 Cardiac arrhythmia, unspecified: Secondary | ICD-10-CM | POA: Diagnosis not present

## 2018-09-22 DIAGNOSIS — Z4801 Encounter for change or removal of surgical wound dressing: Secondary | ICD-10-CM | POA: Diagnosis not present

## 2018-09-22 DIAGNOSIS — S61411D Laceration without foreign body of right hand, subsequent encounter: Secondary | ICD-10-CM | POA: Diagnosis not present

## 2018-09-24 DIAGNOSIS — L97512 Non-pressure chronic ulcer of other part of right foot with fat layer exposed: Secondary | ICD-10-CM | POA: Diagnosis not present

## 2018-09-24 DIAGNOSIS — E1121 Type 2 diabetes mellitus with diabetic nephropathy: Secondary | ICD-10-CM | POA: Diagnosis not present

## 2018-09-24 DIAGNOSIS — G4730 Sleep apnea, unspecified: Secondary | ICD-10-CM | POA: Diagnosis not present

## 2018-09-24 DIAGNOSIS — E1140 Type 2 diabetes mellitus with diabetic neuropathy, unspecified: Secondary | ICD-10-CM | POA: Diagnosis not present

## 2018-09-24 DIAGNOSIS — I10 Essential (primary) hypertension: Secondary | ICD-10-CM | POA: Diagnosis not present

## 2018-09-24 DIAGNOSIS — J449 Chronic obstructive pulmonary disease, unspecified: Secondary | ICD-10-CM | POA: Diagnosis not present

## 2018-09-24 DIAGNOSIS — E11621 Type 2 diabetes mellitus with foot ulcer: Secondary | ICD-10-CM | POA: Diagnosis not present

## 2018-10-01 DIAGNOSIS — E11621 Type 2 diabetes mellitus with foot ulcer: Secondary | ICD-10-CM | POA: Diagnosis not present

## 2018-10-01 DIAGNOSIS — E1140 Type 2 diabetes mellitus with diabetic neuropathy, unspecified: Secondary | ICD-10-CM | POA: Diagnosis not present

## 2018-10-01 DIAGNOSIS — L97519 Non-pressure chronic ulcer of other part of right foot with unspecified severity: Secondary | ICD-10-CM | POA: Diagnosis not present

## 2018-10-01 DIAGNOSIS — E1121 Type 2 diabetes mellitus with diabetic nephropathy: Secondary | ICD-10-CM | POA: Diagnosis not present

## 2018-10-01 DIAGNOSIS — G4730 Sleep apnea, unspecified: Secondary | ICD-10-CM | POA: Diagnosis not present

## 2018-10-01 DIAGNOSIS — I10 Essential (primary) hypertension: Secondary | ICD-10-CM | POA: Diagnosis not present

## 2018-10-01 DIAGNOSIS — J449 Chronic obstructive pulmonary disease, unspecified: Secondary | ICD-10-CM | POA: Diagnosis not present

## 2018-10-01 DIAGNOSIS — L97512 Non-pressure chronic ulcer of other part of right foot with fat layer exposed: Secondary | ICD-10-CM | POA: Diagnosis not present

## 2018-10-03 DIAGNOSIS — I5181 Takotsubo syndrome: Secondary | ICD-10-CM | POA: Diagnosis not present

## 2018-10-03 DIAGNOSIS — I214 Non-ST elevation (NSTEMI) myocardial infarction: Secondary | ICD-10-CM | POA: Diagnosis not present

## 2018-10-05 DIAGNOSIS — I700 Atherosclerosis of aorta: Secondary | ICD-10-CM | POA: Diagnosis not present

## 2018-10-05 DIAGNOSIS — D734 Cyst of spleen: Secondary | ICD-10-CM | POA: Diagnosis not present

## 2018-10-05 DIAGNOSIS — D649 Anemia, unspecified: Secondary | ICD-10-CM | POA: Diagnosis not present

## 2018-10-05 DIAGNOSIS — I959 Hypotension, unspecified: Secondary | ICD-10-CM | POA: Diagnosis not present

## 2018-10-05 DIAGNOSIS — N281 Cyst of kidney, acquired: Secondary | ICD-10-CM | POA: Diagnosis not present

## 2018-10-05 DIAGNOSIS — R7989 Other specified abnormal findings of blood chemistry: Secondary | ICD-10-CM | POA: Diagnosis not present

## 2018-10-05 DIAGNOSIS — J479 Bronchiectasis, uncomplicated: Secondary | ICD-10-CM | POA: Diagnosis not present

## 2018-10-05 DIAGNOSIS — R531 Weakness: Secondary | ICD-10-CM | POA: Diagnosis not present

## 2018-10-09 DIAGNOSIS — R0902 Hypoxemia: Secondary | ICD-10-CM | POA: Diagnosis not present

## 2018-10-10 DIAGNOSIS — I700 Atherosclerosis of aorta: Secondary | ICD-10-CM | POA: Diagnosis not present

## 2018-10-10 DIAGNOSIS — E782 Mixed hyperlipidemia: Secondary | ICD-10-CM | POA: Diagnosis not present

## 2018-10-10 DIAGNOSIS — D649 Anemia, unspecified: Secondary | ICD-10-CM | POA: Diagnosis not present

## 2018-10-10 DIAGNOSIS — I10 Essential (primary) hypertension: Secondary | ICD-10-CM | POA: Diagnosis not present

## 2018-10-10 DIAGNOSIS — E1165 Type 2 diabetes mellitus with hyperglycemia: Secondary | ICD-10-CM | POA: Diagnosis not present

## 2018-10-12 DIAGNOSIS — E7800 Pure hypercholesterolemia, unspecified: Secondary | ICD-10-CM | POA: Diagnosis not present

## 2018-10-12 DIAGNOSIS — M1612 Unilateral primary osteoarthritis, left hip: Secondary | ICD-10-CM | POA: Diagnosis not present

## 2018-10-12 DIAGNOSIS — I10 Essential (primary) hypertension: Secondary | ICD-10-CM | POA: Diagnosis not present

## 2018-10-18 ENCOUNTER — Encounter (HOSPITAL_BASED_OUTPATIENT_CLINIC_OR_DEPARTMENT_OTHER): Payer: Medicare Other | Attending: Internal Medicine

## 2018-10-18 DIAGNOSIS — L97511 Non-pressure chronic ulcer of other part of right foot limited to breakdown of skin: Secondary | ICD-10-CM | POA: Diagnosis not present

## 2018-10-18 DIAGNOSIS — L97519 Non-pressure chronic ulcer of other part of right foot with unspecified severity: Secondary | ICD-10-CM | POA: Insufficient documentation

## 2018-10-18 DIAGNOSIS — E11621 Type 2 diabetes mellitus with foot ulcer: Secondary | ICD-10-CM | POA: Diagnosis not present

## 2018-10-18 DIAGNOSIS — J449 Chronic obstructive pulmonary disease, unspecified: Secondary | ICD-10-CM | POA: Diagnosis not present

## 2018-10-18 DIAGNOSIS — I10 Essential (primary) hypertension: Secondary | ICD-10-CM | POA: Insufficient documentation

## 2018-10-18 DIAGNOSIS — E1121 Type 2 diabetes mellitus with diabetic nephropathy: Secondary | ICD-10-CM | POA: Diagnosis not present

## 2018-10-21 DIAGNOSIS — G4733 Obstructive sleep apnea (adult) (pediatric): Secondary | ICD-10-CM | POA: Diagnosis not present

## 2018-10-21 DIAGNOSIS — I10 Essential (primary) hypertension: Secondary | ICD-10-CM | POA: Diagnosis not present

## 2018-10-21 DIAGNOSIS — J449 Chronic obstructive pulmonary disease, unspecified: Secondary | ICD-10-CM | POA: Diagnosis not present

## 2018-10-21 DIAGNOSIS — R079 Chest pain, unspecified: Secondary | ICD-10-CM | POA: Diagnosis not present

## 2018-10-25 DIAGNOSIS — L97519 Non-pressure chronic ulcer of other part of right foot with unspecified severity: Secondary | ICD-10-CM | POA: Diagnosis not present

## 2018-10-25 DIAGNOSIS — J449 Chronic obstructive pulmonary disease, unspecified: Secondary | ICD-10-CM | POA: Diagnosis not present

## 2018-10-25 DIAGNOSIS — E1121 Type 2 diabetes mellitus with diabetic nephropathy: Secondary | ICD-10-CM | POA: Diagnosis not present

## 2018-10-25 DIAGNOSIS — E11621 Type 2 diabetes mellitus with foot ulcer: Secondary | ICD-10-CM | POA: Diagnosis not present

## 2018-10-25 DIAGNOSIS — I10 Essential (primary) hypertension: Secondary | ICD-10-CM | POA: Diagnosis not present

## 2018-11-06 DIAGNOSIS — G4733 Obstructive sleep apnea (adult) (pediatric): Secondary | ICD-10-CM | POA: Diagnosis not present

## 2018-11-09 DIAGNOSIS — G4733 Obstructive sleep apnea (adult) (pediatric): Secondary | ICD-10-CM | POA: Diagnosis not present

## 2018-12-06 DIAGNOSIS — I10 Essential (primary) hypertension: Secondary | ICD-10-CM | POA: Diagnosis not present

## 2018-12-06 DIAGNOSIS — Z872 Personal history of diseases of the skin and subcutaneous tissue: Secondary | ICD-10-CM | POA: Diagnosis not present

## 2018-12-06 DIAGNOSIS — E1140 Type 2 diabetes mellitus with diabetic neuropathy, unspecified: Secondary | ICD-10-CM | POA: Diagnosis not present

## 2018-12-06 DIAGNOSIS — E11621 Type 2 diabetes mellitus with foot ulcer: Secondary | ICD-10-CM | POA: Diagnosis not present

## 2018-12-06 DIAGNOSIS — J449 Chronic obstructive pulmonary disease, unspecified: Secondary | ICD-10-CM | POA: Diagnosis not present

## 2018-12-06 DIAGNOSIS — L97518 Non-pressure chronic ulcer of other part of right foot with other specified severity: Secondary | ICD-10-CM | POA: Diagnosis not present

## 2018-12-06 DIAGNOSIS — Z7902 Long term (current) use of antithrombotics/antiplatelets: Secondary | ICD-10-CM | POA: Diagnosis not present

## 2018-12-06 DIAGNOSIS — L84 Corns and callosities: Secondary | ICD-10-CM | POA: Diagnosis not present

## 2018-12-06 DIAGNOSIS — Z79899 Other long term (current) drug therapy: Secondary | ICD-10-CM | POA: Diagnosis not present

## 2018-12-10 DIAGNOSIS — R0902 Hypoxemia: Secondary | ICD-10-CM | POA: Diagnosis not present

## 2018-12-12 DIAGNOSIS — I10 Essential (primary) hypertension: Secondary | ICD-10-CM | POA: Diagnosis not present

## 2018-12-12 DIAGNOSIS — E782 Mixed hyperlipidemia: Secondary | ICD-10-CM | POA: Diagnosis not present

## 2018-12-31 DIAGNOSIS — E1140 Type 2 diabetes mellitus with diabetic neuropathy, unspecified: Secondary | ICD-10-CM | POA: Diagnosis not present

## 2018-12-31 DIAGNOSIS — J449 Chronic obstructive pulmonary disease, unspecified: Secondary | ICD-10-CM | POA: Diagnosis not present

## 2018-12-31 DIAGNOSIS — E1165 Type 2 diabetes mellitus with hyperglycemia: Secondary | ICD-10-CM | POA: Diagnosis not present

## 2018-12-31 DIAGNOSIS — L89893 Pressure ulcer of other site, stage 3: Secondary | ICD-10-CM | POA: Diagnosis not present

## 2018-12-31 DIAGNOSIS — L110 Acquired keratosis follicularis: Secondary | ICD-10-CM | POA: Diagnosis not present

## 2018-12-31 DIAGNOSIS — E782 Mixed hyperlipidemia: Secondary | ICD-10-CM | POA: Diagnosis not present

## 2018-12-31 DIAGNOSIS — I10 Essential (primary) hypertension: Secondary | ICD-10-CM | POA: Diagnosis not present

## 2019-01-01 DIAGNOSIS — I5031 Acute diastolic (congestive) heart failure: Secondary | ICD-10-CM | POA: Diagnosis not present

## 2019-01-03 DIAGNOSIS — Z79891 Long term (current) use of opiate analgesic: Secondary | ICD-10-CM | POA: Diagnosis not present

## 2019-01-03 DIAGNOSIS — R4582 Worries: Secondary | ICD-10-CM | POA: Diagnosis not present

## 2019-01-03 DIAGNOSIS — I10 Essential (primary) hypertension: Secondary | ICD-10-CM | POA: Diagnosis not present

## 2019-01-03 DIAGNOSIS — D649 Anemia, unspecified: Secondary | ICD-10-CM | POA: Diagnosis not present

## 2019-01-03 DIAGNOSIS — I700 Atherosclerosis of aorta: Secondary | ICD-10-CM | POA: Diagnosis not present

## 2019-01-03 DIAGNOSIS — E1142 Type 2 diabetes mellitus with diabetic polyneuropathy: Secondary | ICD-10-CM | POA: Diagnosis not present

## 2019-01-03 DIAGNOSIS — E1165 Type 2 diabetes mellitus with hyperglycemia: Secondary | ICD-10-CM | POA: Diagnosis not present

## 2019-01-03 DIAGNOSIS — E782 Mixed hyperlipidemia: Secondary | ICD-10-CM | POA: Diagnosis not present

## 2019-01-03 DIAGNOSIS — J449 Chronic obstructive pulmonary disease, unspecified: Secondary | ICD-10-CM | POA: Diagnosis not present

## 2019-01-09 DIAGNOSIS — R0902 Hypoxemia: Secondary | ICD-10-CM | POA: Diagnosis not present

## 2019-01-14 DIAGNOSIS — L89893 Pressure ulcer of other site, stage 3: Secondary | ICD-10-CM | POA: Diagnosis not present

## 2019-01-14 DIAGNOSIS — L110 Acquired keratosis follicularis: Secondary | ICD-10-CM | POA: Diagnosis not present

## 2019-01-14 DIAGNOSIS — E1140 Type 2 diabetes mellitus with diabetic neuropathy, unspecified: Secondary | ICD-10-CM | POA: Diagnosis not present

## 2019-01-15 DIAGNOSIS — E1142 Type 2 diabetes mellitus with diabetic polyneuropathy: Secondary | ICD-10-CM | POA: Diagnosis not present

## 2019-01-15 DIAGNOSIS — E1165 Type 2 diabetes mellitus with hyperglycemia: Secondary | ICD-10-CM | POA: Diagnosis not present

## 2019-01-16 DIAGNOSIS — M25511 Pain in right shoulder: Secondary | ICD-10-CM | POA: Diagnosis not present

## 2019-02-09 DIAGNOSIS — R0902 Hypoxemia: Secondary | ICD-10-CM | POA: Diagnosis not present

## 2019-02-11 DIAGNOSIS — G4733 Obstructive sleep apnea (adult) (pediatric): Secondary | ICD-10-CM | POA: Diagnosis not present

## 2019-02-11 DIAGNOSIS — M1712 Unilateral primary osteoarthritis, left knee: Secondary | ICD-10-CM | POA: Diagnosis not present

## 2019-02-12 DIAGNOSIS — L89893 Pressure ulcer of other site, stage 3: Secondary | ICD-10-CM | POA: Diagnosis not present

## 2019-02-12 DIAGNOSIS — E1140 Type 2 diabetes mellitus with diabetic neuropathy, unspecified: Secondary | ICD-10-CM | POA: Diagnosis not present

## 2019-02-12 DIAGNOSIS — L110 Acquired keratosis follicularis: Secondary | ICD-10-CM | POA: Diagnosis not present

## 2019-02-28 DIAGNOSIS — L97519 Non-pressure chronic ulcer of other part of right foot with unspecified severity: Secondary | ICD-10-CM | POA: Diagnosis not present

## 2019-02-28 DIAGNOSIS — L97429 Non-pressure chronic ulcer of left heel and midfoot with unspecified severity: Secondary | ICD-10-CM | POA: Diagnosis not present

## 2019-02-28 DIAGNOSIS — S91302A Unspecified open wound, left foot, initial encounter: Secondary | ICD-10-CM | POA: Diagnosis not present

## 2019-02-28 DIAGNOSIS — L84 Corns and callosities: Secondary | ICD-10-CM | POA: Diagnosis not present

## 2019-02-28 DIAGNOSIS — E11621 Type 2 diabetes mellitus with foot ulcer: Secondary | ICD-10-CM | POA: Diagnosis not present

## 2019-02-28 DIAGNOSIS — L97518 Non-pressure chronic ulcer of other part of right foot with other specified severity: Secondary | ICD-10-CM | POA: Diagnosis not present

## 2019-03-04 DIAGNOSIS — E782 Mixed hyperlipidemia: Secondary | ICD-10-CM | POA: Diagnosis not present

## 2019-03-04 DIAGNOSIS — J449 Chronic obstructive pulmonary disease, unspecified: Secondary | ICD-10-CM | POA: Diagnosis not present

## 2019-03-04 DIAGNOSIS — I10 Essential (primary) hypertension: Secondary | ICD-10-CM | POA: Diagnosis not present

## 2019-03-04 DIAGNOSIS — E1142 Type 2 diabetes mellitus with diabetic polyneuropathy: Secondary | ICD-10-CM | POA: Diagnosis not present

## 2019-03-06 DIAGNOSIS — J449 Chronic obstructive pulmonary disease, unspecified: Secondary | ICD-10-CM | POA: Diagnosis not present

## 2019-03-06 DIAGNOSIS — E782 Mixed hyperlipidemia: Secondary | ICD-10-CM | POA: Diagnosis not present

## 2019-03-06 DIAGNOSIS — I10 Essential (primary) hypertension: Secondary | ICD-10-CM | POA: Diagnosis not present

## 2019-03-06 DIAGNOSIS — E1165 Type 2 diabetes mellitus with hyperglycemia: Secondary | ICD-10-CM | POA: Diagnosis not present

## 2019-03-06 DIAGNOSIS — Z1212 Encounter for screening for malignant neoplasm of rectum: Secondary | ICD-10-CM | POA: Diagnosis not present

## 2019-03-06 DIAGNOSIS — Z23 Encounter for immunization: Secondary | ICD-10-CM | POA: Diagnosis not present

## 2019-03-06 DIAGNOSIS — Z0001 Encounter for general adult medical examination with abnormal findings: Secondary | ICD-10-CM | POA: Diagnosis not present

## 2019-03-08 DIAGNOSIS — R0902 Hypoxemia: Secondary | ICD-10-CM | POA: Diagnosis not present

## 2019-03-12 DIAGNOSIS — G4733 Obstructive sleep apnea (adult) (pediatric): Secondary | ICD-10-CM | POA: Diagnosis not present

## 2019-03-12 DIAGNOSIS — U071 COVID-19: Secondary | ICD-10-CM | POA: Diagnosis not present

## 2019-03-14 DIAGNOSIS — I10 Essential (primary) hypertension: Secondary | ICD-10-CM | POA: Diagnosis not present

## 2019-03-14 DIAGNOSIS — E782 Mixed hyperlipidemia: Secondary | ICD-10-CM | POA: Diagnosis not present

## 2019-03-19 DIAGNOSIS — Z452 Encounter for adjustment and management of vascular access device: Secondary | ICD-10-CM | POA: Diagnosis not present

## 2019-03-19 DIAGNOSIS — R410 Disorientation, unspecified: Secondary | ICD-10-CM | POA: Diagnosis not present

## 2019-03-19 DIAGNOSIS — J440 Chronic obstructive pulmonary disease with acute lower respiratory infection: Secondary | ICD-10-CM | POA: Diagnosis not present

## 2019-03-19 DIAGNOSIS — Z9911 Dependence on respirator [ventilator] status: Secondary | ICD-10-CM | POA: Diagnosis not present

## 2019-03-19 DIAGNOSIS — I959 Hypotension, unspecified: Secondary | ICD-10-CM | POA: Diagnosis not present

## 2019-03-19 DIAGNOSIS — L89622 Pressure ulcer of left heel, stage 2: Secondary | ICD-10-CM | POA: Diagnosis not present

## 2019-03-19 DIAGNOSIS — I82492 Acute embolism and thrombosis of other specified deep vein of left lower extremity: Secondary | ICD-10-CM | POA: Diagnosis not present

## 2019-03-19 DIAGNOSIS — Z79891 Long term (current) use of opiate analgesic: Secondary | ICD-10-CM | POA: Diagnosis not present

## 2019-03-19 DIAGNOSIS — E1165 Type 2 diabetes mellitus with hyperglycemia: Secondary | ICD-10-CM | POA: Diagnosis not present

## 2019-03-19 DIAGNOSIS — N179 Acute kidney failure, unspecified: Secondary | ICD-10-CM | POA: Diagnosis not present

## 2019-03-19 DIAGNOSIS — R0603 Acute respiratory distress: Secondary | ICD-10-CM | POA: Diagnosis not present

## 2019-03-19 DIAGNOSIS — J449 Chronic obstructive pulmonary disease, unspecified: Secondary | ICD-10-CM | POA: Diagnosis not present

## 2019-03-19 DIAGNOSIS — J80 Acute respiratory distress syndrome: Secondary | ICD-10-CM | POA: Diagnosis not present

## 2019-03-19 DIAGNOSIS — R0602 Shortness of breath: Secondary | ICD-10-CM | POA: Diagnosis not present

## 2019-03-19 DIAGNOSIS — D689 Coagulation defect, unspecified: Secondary | ICD-10-CM | POA: Diagnosis not present

## 2019-03-19 DIAGNOSIS — Z66 Do not resuscitate: Secondary | ICD-10-CM | POA: Diagnosis not present

## 2019-03-19 DIAGNOSIS — R5081 Fever presenting with conditions classified elsewhere: Secondary | ICD-10-CM | POA: Diagnosis not present

## 2019-03-19 DIAGNOSIS — R001 Bradycardia, unspecified: Secondary | ICD-10-CM | POA: Diagnosis not present

## 2019-03-19 DIAGNOSIS — R6520 Severe sepsis without septic shock: Secondary | ICD-10-CM | POA: Diagnosis not present

## 2019-03-19 DIAGNOSIS — I824Z2 Acute embolism and thrombosis of unspecified deep veins of left distal lower extremity: Secondary | ICD-10-CM | POA: Diagnosis not present

## 2019-03-19 DIAGNOSIS — Z4682 Encounter for fitting and adjustment of non-vascular catheter: Secondary | ICD-10-CM | POA: Diagnosis not present

## 2019-03-19 DIAGNOSIS — I82462 Acute embolism and thrombosis of left calf muscular vein: Secondary | ICD-10-CM | POA: Diagnosis not present

## 2019-03-19 DIAGNOSIS — I10 Essential (primary) hypertension: Secondary | ICD-10-CM | POA: Diagnosis not present

## 2019-03-19 DIAGNOSIS — J189 Pneumonia, unspecified organism: Secondary | ICD-10-CM | POA: Diagnosis not present

## 2019-03-19 DIAGNOSIS — Z743 Need for continuous supervision: Secondary | ICD-10-CM | POA: Diagnosis not present

## 2019-03-19 DIAGNOSIS — E119 Type 2 diabetes mellitus without complications: Secondary | ICD-10-CM | POA: Diagnosis not present

## 2019-03-19 DIAGNOSIS — Z720 Tobacco use: Secondary | ICD-10-CM | POA: Diagnosis not present

## 2019-03-19 DIAGNOSIS — E7800 Pure hypercholesterolemia, unspecified: Secondary | ICD-10-CM | POA: Diagnosis not present

## 2019-03-19 DIAGNOSIS — Z79899 Other long term (current) drug therapy: Secondary | ICD-10-CM | POA: Diagnosis not present

## 2019-03-19 DIAGNOSIS — A419 Sepsis, unspecified organism: Secondary | ICD-10-CM | POA: Diagnosis not present

## 2019-03-19 DIAGNOSIS — R6521 Severe sepsis with septic shock: Secondary | ICD-10-CM | POA: Diagnosis not present

## 2019-03-19 DIAGNOSIS — J9601 Acute respiratory failure with hypoxia: Secondary | ICD-10-CM | POA: Diagnosis not present

## 2019-03-19 DIAGNOSIS — R140 Abdominal distension (gaseous): Secondary | ICD-10-CM | POA: Diagnosis not present

## 2019-03-19 DIAGNOSIS — U071 COVID-19: Secondary | ICD-10-CM | POA: Diagnosis not present

## 2019-03-19 DIAGNOSIS — R069 Unspecified abnormalities of breathing: Secondary | ICD-10-CM | POA: Diagnosis not present

## 2019-03-19 DIAGNOSIS — F17200 Nicotine dependence, unspecified, uncomplicated: Secondary | ICD-10-CM | POA: Diagnosis not present

## 2019-03-19 DIAGNOSIS — J9691 Respiratory failure, unspecified with hypoxia: Secondary | ICD-10-CM | POA: Diagnosis not present

## 2019-03-19 DIAGNOSIS — R918 Other nonspecific abnormal finding of lung field: Secondary | ICD-10-CM | POA: Diagnosis not present

## 2019-03-19 DIAGNOSIS — R0902 Hypoxemia: Secondary | ICD-10-CM | POA: Diagnosis not present

## 2019-03-19 DIAGNOSIS — Z7902 Long term (current) use of antithrombotics/antiplatelets: Secondary | ICD-10-CM | POA: Diagnosis not present

## 2019-03-19 DIAGNOSIS — A4189 Other specified sepsis: Secondary | ICD-10-CM | POA: Diagnosis not present

## 2019-03-19 DIAGNOSIS — E785 Hyperlipidemia, unspecified: Secondary | ICD-10-CM | POA: Diagnosis not present

## 2019-03-19 DIAGNOSIS — I348 Other nonrheumatic mitral valve disorders: Secondary | ICD-10-CM | POA: Diagnosis not present

## 2019-03-19 DIAGNOSIS — R0600 Dyspnea, unspecified: Secondary | ICD-10-CM | POA: Diagnosis not present

## 2019-03-20 DIAGNOSIS — R918 Other nonspecific abnormal finding of lung field: Secondary | ICD-10-CM | POA: Diagnosis not present

## 2019-03-20 DIAGNOSIS — J9601 Acute respiratory failure with hypoxia: Secondary | ICD-10-CM | POA: Diagnosis not present

## 2019-03-20 DIAGNOSIS — Z452 Encounter for adjustment and management of vascular access device: Secondary | ICD-10-CM | POA: Diagnosis not present

## 2019-03-20 DIAGNOSIS — U071 COVID-19: Secondary | ICD-10-CM | POA: Diagnosis not present

## 2019-03-20 DIAGNOSIS — Z4682 Encounter for fitting and adjustment of non-vascular catheter: Secondary | ICD-10-CM | POA: Diagnosis not present

## 2019-03-23 DIAGNOSIS — Z4682 Encounter for fitting and adjustment of non-vascular catheter: Secondary | ICD-10-CM | POA: Diagnosis not present

## 2019-03-23 DIAGNOSIS — Z9911 Dependence on respirator [ventilator] status: Secondary | ICD-10-CM | POA: Diagnosis not present

## 2019-03-23 DIAGNOSIS — R0603 Acute respiratory distress: Secondary | ICD-10-CM | POA: Diagnosis not present

## 2019-03-23 DIAGNOSIS — Z452 Encounter for adjustment and management of vascular access device: Secondary | ICD-10-CM | POA: Diagnosis not present

## 2019-03-23 DIAGNOSIS — R140 Abdominal distension (gaseous): Secondary | ICD-10-CM | POA: Diagnosis not present

## 2019-03-23 DIAGNOSIS — R918 Other nonspecific abnormal finding of lung field: Secondary | ICD-10-CM | POA: Diagnosis not present

## 2019-03-25 DIAGNOSIS — I824Z2 Acute embolism and thrombosis of unspecified deep veins of left distal lower extremity: Secondary | ICD-10-CM | POA: Diagnosis not present

## 2019-03-26 DIAGNOSIS — I348 Other nonrheumatic mitral valve disorders: Secondary | ICD-10-CM | POA: Diagnosis not present

## 2019-03-26 DIAGNOSIS — U071 COVID-19: Secondary | ICD-10-CM | POA: Diagnosis not present

## 2019-03-26 DIAGNOSIS — R001 Bradycardia, unspecified: Secondary | ICD-10-CM | POA: Diagnosis not present

## 2019-03-27 DIAGNOSIS — R410 Disorientation, unspecified: Secondary | ICD-10-CM | POA: Diagnosis not present

## 2019-03-28 DIAGNOSIS — R0902 Hypoxemia: Secondary | ICD-10-CM | POA: Diagnosis not present

## 2019-03-28 DIAGNOSIS — R918 Other nonspecific abnormal finding of lung field: Secondary | ICD-10-CM | POA: Diagnosis not present

## 2019-03-29 DIAGNOSIS — Z4682 Encounter for fitting and adjustment of non-vascular catheter: Secondary | ICD-10-CM | POA: Diagnosis not present

## 2019-03-29 DIAGNOSIS — R918 Other nonspecific abnormal finding of lung field: Secondary | ICD-10-CM | POA: Diagnosis not present

## 2019-03-29 DIAGNOSIS — R0600 Dyspnea, unspecified: Secondary | ICD-10-CM | POA: Diagnosis not present

## 2019-03-30 DIAGNOSIS — J9601 Acute respiratory failure with hypoxia: Secondary | ICD-10-CM | POA: Diagnosis not present

## 2019-03-30 DIAGNOSIS — U071 COVID-19: Secondary | ICD-10-CM | POA: Diagnosis not present

## 2019-04-15 DEATH — deceased

## 2021-05-26 IMAGING — DX RIGHT FOOT COMPLETE - 3+ VIEW
3 series · 3 of 3 positions shown · non-contrast
Comparison: None.

CLINICAL DATA: Foot ulcer

EXAM:
RIGHT FOOT COMPLETE - 3+ VIEW

[foot ap]
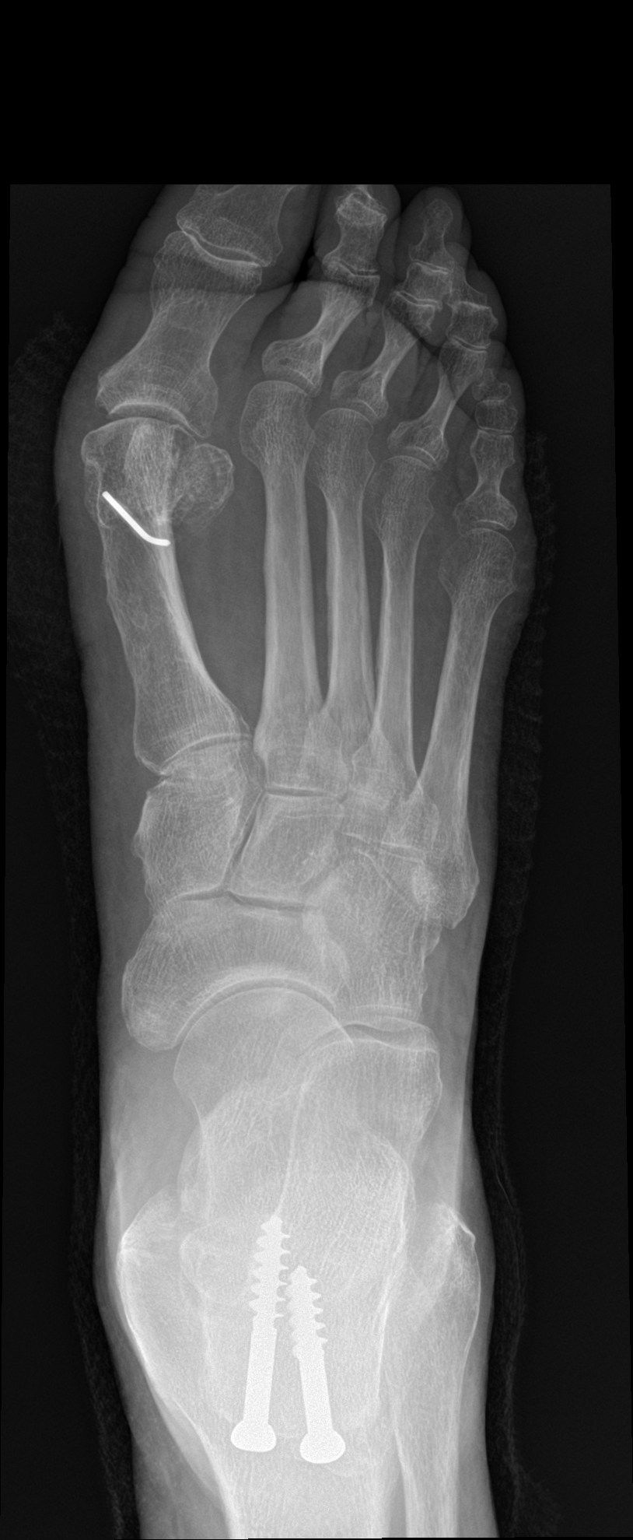

[foot obl]
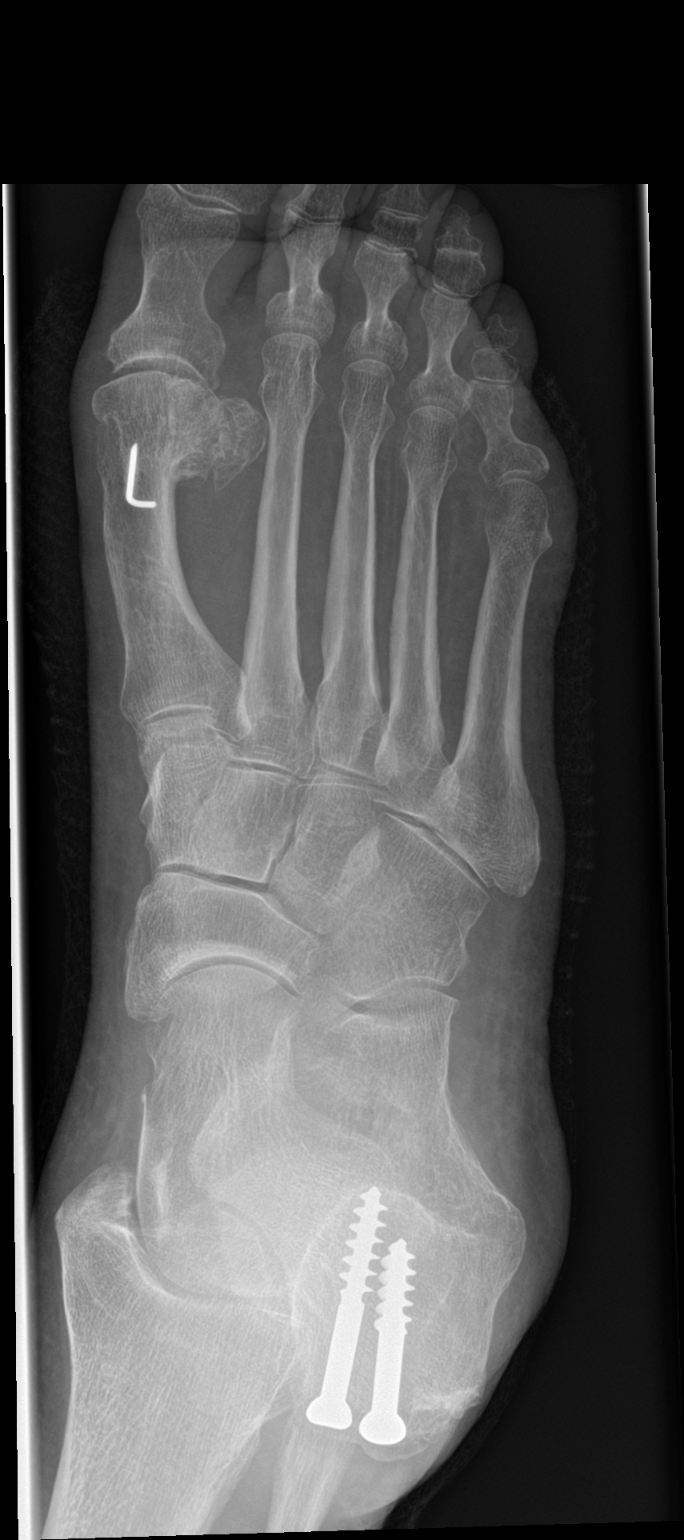

[foot lat]
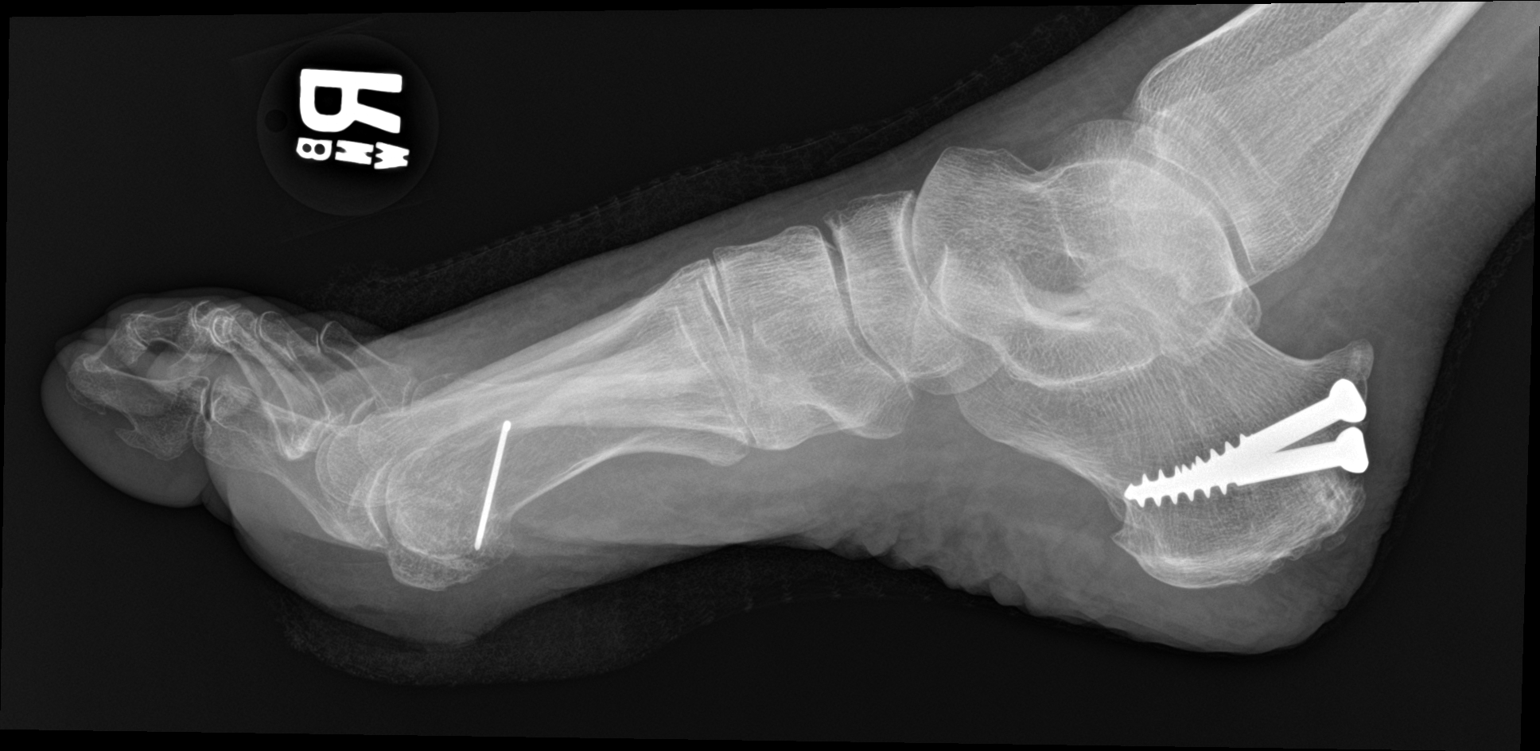

[3 of 3 positions shown; findings below may reference images not displayed]

FINDINGS: The patient is status post prior orthopedic intervention involving
the first metatarsal. There are degenerative changes of the first
metatarsophalangeal joint. There is no displaced fracture or
dislocation. Cannulated screws are noted through the posterior
calcaneus. A plan tear ulcer is noted and appears to be at the level
of the first metatarsal head. There is no convincing evidence for
underlying osteomyelitis. There is soft tissue swelling about the
forefoot.
IMPRESSION: 1. No acute displaced fracture or dislocation.
2. No radiographic evidence of osteomyelitis.
